# Patient Record
Sex: Male | Born: 1945 | Race: White | Hispanic: No | Marital: Married | Smoking: Former smoker
Health system: Southern US, Community
[De-identification: ages and names within clinical notes are randomized; demographics above are authoritative.]

## PROBLEM LIST (undated history)

## (undated) DIAGNOSIS — I6529 Occlusion and stenosis of unspecified carotid artery: Secondary | ICD-10-CM

## (undated) DIAGNOSIS — E785 Hyperlipidemia, unspecified: Secondary | ICD-10-CM

## (undated) DIAGNOSIS — I10 Essential (primary) hypertension: Secondary | ICD-10-CM

## (undated) DIAGNOSIS — I639 Cerebral infarction, unspecified: Secondary | ICD-10-CM

## (undated) DIAGNOSIS — J449 Chronic obstructive pulmonary disease, unspecified: Secondary | ICD-10-CM

## (undated) HISTORY — DX: Occlusion and stenosis of unspecified carotid artery: I65.29

## (undated) HISTORY — DX: Hyperlipidemia, unspecified: E78.5

## (undated) HISTORY — DX: Chronic obstructive pulmonary disease, unspecified: J44.9

## (undated) HISTORY — DX: Cerebral infarction, unspecified: I63.9

## (undated) HISTORY — DX: Essential (primary) hypertension: I10

---

## 2001-01-07 ENCOUNTER — Ambulatory Visit (HOSPITAL_COMMUNITY): Admission: RE | Admit: 2001-01-07 | Discharge: 2001-01-07 | Payer: Self-pay | Admitting: Family Medicine

## 2001-01-07 ENCOUNTER — Encounter: Payer: Self-pay | Admitting: Family Medicine

## 2003-02-06 ENCOUNTER — Ambulatory Visit (HOSPITAL_COMMUNITY): Admission: RE | Admit: 2003-02-06 | Discharge: 2003-02-06 | Payer: Self-pay | Admitting: Family Medicine

## 2003-02-06 ENCOUNTER — Encounter: Payer: Self-pay | Admitting: Family Medicine

## 2009-06-19 HISTORY — PX: CAROTID ENDARTERECTOMY: SUR193

## 2009-09-24 ENCOUNTER — Ambulatory Visit (HOSPITAL_COMMUNITY): Admission: RE | Admit: 2009-09-24 | Discharge: 2009-09-24 | Payer: Self-pay | Admitting: Family Medicine

## 2009-09-29 ENCOUNTER — Encounter: Admission: RE | Admit: 2009-09-29 | Discharge: 2009-09-29 | Payer: Self-pay | Admitting: Vascular Surgery

## 2009-09-29 ENCOUNTER — Ambulatory Visit: Payer: Self-pay | Admitting: Vascular Surgery

## 2009-10-12 ENCOUNTER — Encounter: Payer: Self-pay | Admitting: Vascular Surgery

## 2009-10-12 ENCOUNTER — Ambulatory Visit: Payer: Self-pay | Admitting: Cardiovascular Disease

## 2009-10-12 ENCOUNTER — Inpatient Hospital Stay (HOSPITAL_COMMUNITY): Admission: RE | Admit: 2009-10-12 | Discharge: 2009-10-13 | Payer: Self-pay | Admitting: Vascular Surgery

## 2009-10-12 ENCOUNTER — Ambulatory Visit: Payer: Self-pay | Admitting: Vascular Surgery

## 2009-10-17 ENCOUNTER — Ambulatory Visit: Payer: Self-pay | Admitting: Pulmonary Disease

## 2009-10-17 ENCOUNTER — Inpatient Hospital Stay (HOSPITAL_COMMUNITY): Admission: EM | Admit: 2009-10-17 | Discharge: 2009-10-21 | Payer: Self-pay | Admitting: Emergency Medicine

## 2009-11-03 ENCOUNTER — Ambulatory Visit: Payer: Self-pay | Admitting: Vascular Surgery

## 2010-07-10 ENCOUNTER — Encounter: Payer: Self-pay | Admitting: Family Medicine

## 2010-09-06 LAB — CBC
HCT: 31.5 % — ABNORMAL LOW (ref 39.0–52.0)
HCT: 33.6 % — ABNORMAL LOW (ref 39.0–52.0)
HCT: 44.3 % (ref 39.0–52.0)
HCT: 47 % (ref 39.0–52.0)
Hemoglobin: 11.8 g/dL — ABNORMAL LOW (ref 13.0–17.0)
Hemoglobin: 11.8 g/dL — ABNORMAL LOW (ref 13.0–17.0)
Hemoglobin: 15 g/dL (ref 13.0–17.0)
Hemoglobin: 12.6 g/dL — ABNORMAL LOW (ref 13.0–17.0)
Hemoglobin: 16.5 g/dL (ref 13.0–17.0)
MCHC: 33.9 g/dL (ref 30.0–36.0)
MCHC: 35 g/dL (ref 30.0–36.0)
MCHC: 35 g/dL (ref 30.0–36.0)
MCHC: 35.1 g/dL (ref 30.0–36.0)
MCHC: 35.3 g/dL (ref 30.0–36.0)
MCV: 90.6 fL (ref 78.0–100.0)
MCV: 91.4 fL (ref 78.0–100.0)
MCV: 90.7 fL (ref 78.0–100.0)
Platelets: 267 10*3/uL (ref 150–400)
Platelets: 233 10*3/uL (ref 150–400)
RBC: 3.48 MIL/uL — ABNORMAL LOW (ref 4.22–5.81)
RBC: 3.67 MIL/uL — ABNORMAL LOW (ref 4.22–5.81)
RBC: 3.97 MIL/uL — ABNORMAL LOW (ref 4.22–5.81)
RDW: 14.1 % (ref 11.5–15.5)
RDW: 14.1 % (ref 11.5–15.5)
RDW: 14.4 % (ref 11.5–15.5)
WBC: 6.9 10*3/uL (ref 4.0–10.5)
WBC: 7.5 10*3/uL (ref 4.0–10.5)

## 2010-09-06 LAB — BASIC METABOLIC PANEL
BUN: 13 mg/dL (ref 6–23)
CO2: 22 mEq/L (ref 19–32)
CO2: 24 mEq/L (ref 19–32)
CO2: 25 mEq/L (ref 19–32)
Calcium: 8.1 mg/dL — ABNORMAL LOW (ref 8.4–10.5)
Chloride: 104 mEq/L (ref 96–112)
Chloride: 110 mEq/L (ref 96–112)
Chloride: 113 mEq/L — ABNORMAL HIGH (ref 96–112)
Creatinine, Ser: 1.11 mg/dL (ref 0.4–1.5)
GFR calc Af Amer: >60 mL/min (ref >60)
GFR calc Af Amer: >60 mL/min (ref >60)
GFR calc Af Amer: >60 mL/min (ref >60)
GFR calc non Af Amer: >60 mL/min (ref >60)
Glucose, Bld: 128 mg/dL — ABNORMAL HIGH (ref 70–99)
Potassium: 3.5 mEq/L (ref 3.5–5.1)
Potassium: 3.5 mEq/L (ref 3.5–5.1)
Potassium: 3.7 mEq/L (ref 3.5–5.1)
Sodium: 135 mEq/L (ref 135–145)
Sodium: 139 mEq/L (ref 135–145)
Sodium: 137 mEq/L (ref 135–145)

## 2010-09-06 LAB — POCT I-STAT 3, ART BLOOD GAS (G3+)
Acid-base deficit: 2 mmol/L (ref 0.0–2.0)
Acid-base deficit: 7 mmol/L — ABNORMAL HIGH (ref 0.0–2.0)
Bicarbonate: 22.2 mEq/L (ref 20.0–24.0)
Bicarbonate: 22.8 mEq/L (ref 20.0–24.0)
Bicarbonate: 23.1 mEq/L (ref 20.0–24.0)
O2 Saturation: 97 %
Patient temperature: 37.2
TCO2: 24 mmol/L (ref 0–100)
TCO2: 24 mmol/L (ref 0–100)
pCO2 arterial: 41.2 mmHg (ref 35.0–45.0)
pCO2 arterial: 43.5 mmHg (ref 35.0–45.0)
pCO2 arterial: 44 mmHg (ref 35.0–45.0)
pH, Arterial: 7.318 — ABNORMAL LOW (ref 7.350–7.450)
pH, Arterial: 7.325 — ABNORMAL LOW (ref 7.350–7.450)
pO2, Arterial: 124 mmHg — ABNORMAL HIGH (ref 80.0–100.0)
pO2, Arterial: 65 mmHg — ABNORMAL LOW (ref 80.0–100.0)
pO2, Arterial: 72 mmHg — ABNORMAL LOW (ref 80.0–100.0)
pO2, Arterial: 91 mmHg (ref 80.0–100.0)

## 2010-09-06 LAB — COMPREHENSIVE METABOLIC PANEL
ALT: 24 U/L (ref 0–53)
Albumin: 3.9 g/dL (ref 3.5–5.2)
Alkaline Phosphatase: 53 U/L (ref 39–117)
Alkaline Phosphatase: 68 U/L (ref 39–117)
BUN: 16 mg/dL (ref 6–23)
BUN: 15 mg/dL (ref 6–23)
Calcium: 7.7 mg/dL — ABNORMAL LOW (ref 8.4–10.5)
Calcium: 9.1 mg/dL (ref 8.4–10.5)
Creatinine, Ser: 1.1 mg/dL (ref 0.4–1.5)
Creatinine, Ser: 1.01 mg/dL (ref 0.4–1.5)
GFR calc non Af Amer: >60 mL/min (ref >60)
Glucose, Bld: 145 mg/dL — ABNORMAL HIGH (ref 70–99)
Glucose, Bld: 111 mg/dL — ABNORMAL HIGH (ref 70–99)
Potassium: 4.6 mEq/L (ref 3.5–5.1)
Sodium: 135 mEq/L (ref 135–145)
Total Bilirubin: 0.8 mg/dL (ref 0.3–1.2)
Total Protein: 7.3 g/dL (ref 6.0–8.3)
Total Protein: 6.8 g/dL (ref 6.0–8.3)

## 2010-09-06 LAB — URINALYSIS, ROUTINE W REFLEX MICROSCOPIC
Bilirubin Urine: NEGATIVE
Hgb urine dipstick: NEGATIVE
Ketones, ur: NEGATIVE mg/dL
Ketones, ur: NEGATIVE mg/dL
Leukocytes, UA: NEGATIVE
Leukocytes, UA: NEGATIVE
Nitrite: NEGATIVE
Nitrite: NEGATIVE
Nitrite: NEGATIVE
Protein, ur: 100 mg/dL — AB
Protein, ur: 30 mg/dL — AB
Protein, ur: 100 mg/dL — AB
Specific Gravity, Urine: 1.017 (ref 1.005–1.030)
Urobilinogen, UA: 1 mg/dL (ref 0.0–1.0)
pH: 5 (ref 5.0–8.0)

## 2010-09-06 LAB — DIFFERENTIAL
Basophils Absolute: 0.1 10*3/uL (ref 0.0–0.1)
Eosinophils Relative: 3 % (ref 0–5)
Monocytes Relative: 13 % — ABNORMAL HIGH (ref 3–12)
Neutrophils Relative %: 49 % (ref 43–77)

## 2010-09-06 LAB — URINE CULTURE: Culture: NO GROWTH

## 2010-09-06 LAB — LIPID PANEL
Total CHOL/HDL Ratio: 3.7 RATIO
Triglycerides: 75 mg/dL (ref <150)
VLDL: 15 mg/dL (ref 0–40)

## 2010-09-06 LAB — MAGNESIUM: Magnesium: 2.1 mg/dL (ref 1.5–2.5)

## 2010-09-06 LAB — PHOSPHORUS
Phosphorus: 2.6 mg/dL (ref 2.3–4.6)
Phosphorus: 3.4 mg/dL (ref 2.3–4.6)
Phosphorus: 4.8 mg/dL — ABNORMAL HIGH (ref 2.3–4.6)

## 2010-09-06 LAB — CK TOTAL AND CKMB (NOT AT ARMC)
CK, MB: 2.9 ng/mL (ref 0.3–4.0)
Relative Index: 2.9 — ABNORMAL HIGH (ref 0.0–2.5)
Total CK: 100 U/L (ref 7–232)

## 2010-09-06 LAB — URINE MICROSCOPIC-ADD ON

## 2010-09-06 LAB — HEMOGLOBIN A1C
Hgb A1c MFr Bld: 5.9 % — ABNORMAL HIGH (ref <5.7)
Mean Plasma Glucose: 123 mg/dL — ABNORMAL HIGH (ref <117)

## 2010-09-06 LAB — TROPONIN I: Troponin I: 0.01 ng/mL (ref 0.00–0.06)

## 2010-09-06 LAB — LACTIC ACID, PLASMA: Lactic Acid, Venous: 24.1 mmol/L — ABNORMAL HIGH (ref 0.5–2.2)

## 2010-09-06 LAB — LEGIONELLA ANTIGEN, URINE

## 2010-09-06 LAB — CULTURE, RESPIRATORY W GRAM STAIN

## 2010-09-06 LAB — ABO/RH: ABO/RH(D): O POS

## 2010-09-06 LAB — BRAIN NATRIURETIC PEPTIDE
Pro B Natriuretic peptide (BNP): 325 pg/mL — ABNORMAL HIGH (ref 0.0–100.0)
Pro B Natriuretic peptide (BNP): 479 pg/mL — ABNORMAL HIGH (ref 0.0–100.0)

## 2010-09-06 LAB — TYPE AND SCREEN

## 2010-09-06 LAB — RAPID URINE DRUG SCREEN, HOSP PERFORMED
Barbiturates: NOT DETECTED
Benzodiazepines: POSITIVE — AB

## 2010-09-06 LAB — GLUCOSE, CAPILLARY: Glucose-Capillary: 98 mg/dL (ref 70–99)

## 2010-09-06 LAB — PROTIME-INR
INR: 0.97 (ref 0.00–1.49)
Prothrombin Time: 14.5 seconds (ref 11.6–15.2)
Prothrombin Time: 15.3 seconds — ABNORMAL HIGH (ref 11.6–15.2)
Prothrombin Time: 12.8 seconds (ref 11.6–15.2)

## 2010-09-06 LAB — CULTURE, BLOOD (ROUTINE X 2)
Culture: NO GROWTH
Culture: NO GROWTH

## 2010-09-06 LAB — APTT: aPTT: 29 seconds (ref 24–37)

## 2010-11-01 NOTE — Assessment & Plan Note (Signed)
OFFICE VISIT   ESCHOL, AUXIER  DOB:  01-09-1946                                       11/03/2009  CHART#:[id number]   Mr. Saunders returns for follow-up today.  He underwent a left carotid  endarterectomy on October 12, 2009.  His postoperative stay was  uneventful.  However, he was readmitted to the hospital several days  after his operation with a reperfusion syndrome.  This manifested itself  as a seizure.  He also had some confusion during the time of admission.  He was initially admitted to the intensive care unit and then discharged  within a few days.  He returns today for further followup.   On physical exam today, his neck incision is well-healed.  There is no  carotid bruit.  Blood pressure today lower extremity.  This was measured  in his leg because he has known subclavian artery occlusive disease.  Pulses 63.  Temperature is 98.2.  Neurologic exam shows symmetric upper  extremity lower extremity motor strength which is 5/5.   Mr. Bartolomei is doing well at this point.  He seems to have recovered from  his reperfusion syndrome.  He will follow up with neurology regarding  his seizure medication.  He will currently stay on aspirin 325 mg once a  day.  He will follow up in our carotid surveillance protocol.     Janetta Hora. Fields, MD  Electronically Signed   CEF/MEDQ  D:  11/17/2009  T:  11/18/2009  Job:  [id number]

## 2010-11-01 NOTE — Procedures (Signed)
CAROTID DUPLEX EXAM   INDICATION:  Followup carotid disease.   HISTORY:  Diabetes:  No.  Cardiac:  No.  Hypertension:  No.  Smoking:  Yes.  Previous Surgery:  No.  CV History:  No.  Amaurosis Fugax No, Paresthesias No, Hemiparesis No                                       RIGHT             LEFT  Brachial systolic pressure:         94                98  Brachial Doppler waveforms:         Biphasic          Biphasic  Vertebral direction of flow:                          Bidirectional  DUPLEX VELOCITIES (cm/sec)  CCA peak systolic                                     185 distal 465  ECA peak systolic                                     238  ICA peak systolic                                     420  ICA end diastolic                                     163  PLAQUE MORPHOLOGY:                                    Heterogeneous  PLAQUE AMOUNT:                                        Severe  PLAQUE LOCATION:                                      ICA, ECA, CCA   IMPRESSION:  1. Left internal carotid artery suggests 80%-99% stenosis.  2. Left external carotid artery stenosis.  3. Distal left common carotid artery has elevated velocities of 465      cm/s.  4. Bidirectional left vertebral.   ___________________________________________  Janetta Hora. Fields, MD   CB/MEDQ  D:  09/29/2009  T:  09/29/2009  Job:  [id number]

## 2010-11-01 NOTE — Consult Note (Signed)
NEW PATIENT CONSULTATION   Zachary Edwards, Zachary Edwards  DOB:  01-24-1946                                       09/29/2009  CHART#:[id number]   CHIEF COMPLAINT:  Carotid stenosis.   The patient is a 65 year old male referred for evaluation of carotid  stenosis.  His primary care doctor is Dr. Gerda Diss and he is sent here  today for evaluation of this.  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  He does not have any symptoms of TIA, amaurosis or  stroke.  Primary atherosclerotic risk factors include elevated  cholesterol and smoking.  He has been a heavy smoker for a number of  years.  He is currently on aspirin 81 mg once a day.  He also takes  pravastatin.   He denies any episodes of syncope.  He denies any episodes of dizziness.  He has never had a previous seizure.  He denies headaches.   PAST MEDICAL HISTORY:  Otherwise unremarkable.   FAMILY HISTORY:  Unremarkable.   SOCIAL HISTORY:  He is married, works as a Naval architect, has three  children.  Smoking history is as listed above.  He is not consume  alcohol regularly.  Greater than 3 minutes today were spent regarding  smoking cessation counseling involving several different techniques and  the risks of long-term smoking.   REVIEW OF SYSTEMS:  A full 12 point review of systems was performed with  the patient today.  Please see intake referral form for details  regarding this.  Of note, he denied any symptoms of coronary disease  such as shortness of breath or chest pain and he states he is able to  climb greater than 2 flights of stairs without chest pain.   PHYSICAL EXAMINATION:  Blood pressure is 100/68 in the left arm, 94/63  in the right arm; heart rate 63 and regular, temperature is 98.2.  HEENT:  Unremarkable.  Neck:  2+ carotid pulses with bilateral bruits.  He also has supraclavicular bruits.  Chest:  Clear to  auscultation.  Cardiac:  Regular rate and rhythm without murmur.  Abdomen:  Soft,  nontender, nondistended.  No masses.  Extremities:  He has absent  brachial and radial pulses in the upper extremities, he has 2+ femoral  pulses.  He has absent pedal pulses in the left foot.  He has a 2+ right  posterior tibial pulse.  He has absent right dorsalis pedis pulse.  Skin:  Has no open ulcers or rashes.  Musculoskeletal:  Exam shows no  significant major joint deformities.  Neurologic exam shows symmetric  upper extremity and lower extremity motor strength which is 5/5.  Cranial nerves II-XII are intact.   He had a carotid duplex exam today on the left side which showed a  greater than 80% left internal carotid artery stenosis.  The distal left  common carotid artery also had elevated velocities suggesting some  proximal common carotid disease.  He had bidirectional flow in the left  vertebral artery suggesting subclavian stenosis.  This was compared to  his previous carotid duplex exam at [facility] which was  performed on September 24, 2009.  This showed antegrade flow in the right  vertebral artery, no significant stenosis in the right internal carotid  artery and a high-grade stenosis in the left internal carotid artery.   Based on his absent upper extremity pulses and the presence of proximal  left common carotid disease, we obtained a CT angiogram of the neck on  him today.  This showed occlusion of his innominate artery.  This also  showed occlusion of his left subclavian artery.  He had bilateral small  vertebrals that both had flow within them.  This also confirmed the left  internal carotid artery stenosis, which is high-grade, but also  extension of plaque into the proximal common carotid artery, although  this is all fairly well-defined and confined to the cervical portion of  the carotid artery.   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  I believe he is at high risk of stroke without revascularization.  We  have scheduled him for a left carotid endarterectomy in the next 2  weeks.  Risks, benefits, possible complications of procedure, including  but not limited to bleeding, infection, stroke risk 1% to 2% and cranial  nerve injury were discussed with the patient today.  He understands and  agrees to proceed.  He will continue to take his aspirin daily, even the  day of operation.  We will also discuss with him that upper extremity  blood pressure measurements are not going to be accurate on him because  of bilateral subclavian artery occlusions.  I do not believe these need  treatment currently since they are asymptomatic with no evidence of  upper extremity weakness and no evidence of steal-type symptoms.  His  most accurate blood pressure is going to be in his lower extremity.  Most likely he has hypertension that is unknown due to the fact that he  has subclavian artery occlusive disease.     Janetta Hora. Fields, MD  Electronically Signed   CEF/MEDQ  D:  09/30/2009  T:  09/30/2009  Job:  [id number]   cc:   Lorin Picket A. Gerda Diss, MD

## 2011-09-13 ENCOUNTER — Encounter: Payer: Self-pay | Admitting: Vascular Surgery

## 2011-09-14 ENCOUNTER — Other Ambulatory Visit: Payer: Self-pay

## 2011-09-14 ENCOUNTER — Ambulatory Visit: Payer: Self-pay | Admitting: Vascular Surgery

## 2011-09-20 ENCOUNTER — Encounter: Payer: Self-pay | Admitting: Vascular Surgery

## 2011-09-21 ENCOUNTER — Ambulatory Visit (INDEPENDENT_AMBULATORY_CARE_PROVIDER_SITE_OTHER): Payer: Medicare Other | Admitting: *Deleted

## 2011-09-21 ENCOUNTER — Encounter: Payer: Self-pay | Admitting: Vascular Surgery

## 2011-09-21 ENCOUNTER — Ambulatory Visit (INDEPENDENT_AMBULATORY_CARE_PROVIDER_SITE_OTHER): Payer: Medicare Other | Admitting: Vascular Surgery

## 2011-09-21 VITALS — BP 95/65 | HR 72 | Temp 98.4°F | Resp 16 | Ht 73.0 in | Wt 172.0 lb

## 2011-09-21 DIAGNOSIS — I6529 Occlusion and stenosis of unspecified carotid artery: Secondary | ICD-10-CM

## 2011-09-21 DIAGNOSIS — Z48812 Encounter for surgical aftercare following surgery on the circulatory system: Secondary | ICD-10-CM

## 2011-09-21 NOTE — Progress Notes (Signed)
Addended by: Sharee Pimple on: 09/21/2011 04:08 PM   Modules accepted: Orders

## 2011-09-21 NOTE — Progress Notes (Signed)
[facility] [place] HISTORY AND PHYSICAL   History of Present Illness:  Patient is a 66 y.o. year old male who presents for follow-up evaluation for carotid stenosis.  He is on Plavix/Aspirin for antiplatelet therapy.  His atherosclerotic risk factors remain elevated cholesterol, hypertension, smoking (remote), and COPD.  These are all currently stable and followed by his primary care physician.  He denies any new neurologic events including amaurosis, numbness, or weakness.  He was last seen in 2011. He previously underwent a left carotid endarterectomy in April of 2011. He has a known chronic innominate artery occlusion as well as a known chronic left subclavian artery occlusion. He also had a hyperperfusion syndrome at the time of his carotid endarterectomy.  Past Medical History  Diagnosis Date  . Hypertension   . Hyperlipidemia   . Carotid artery occlusion   . COPD (chronic obstructive pulmonary disease)     Past Surgical History  Procedure Date  . Carotid endarterectomy 2011    Left CEA    Review of Systems:  Neurologic: as above Cardiac:denies shortness of breath or chest pain Pulmonary: denies cough or wheeze  Social History History  Substance Use Topics  . Smoking status: Former Smoker    Quit date: 09/17/2009  . Smokeless tobacco: Not on file  . Alcohol Use: No    Allergies  Allergies  Allergen Reactions  . Penicillins      Current Outpatient Prescriptions  Medication Sig Dispense Refill  . amLODipine (NORVASC) 10 MG tablet Take 10 mg by mouth daily.      Marland Kitchen aspirin 81 MG tablet Take 81 mg by mouth daily.      . hydrochlorothiazide (HYDRODIURIL) 25 MG tablet Take 25 mg by mouth daily.      . pravastatin (PRAVACHOL) 20 MG tablet Take 40 mg by mouth daily.       Marland Kitchen levETIRAcetam (KEPPRA) 1000 MG tablet Take 1,000 mg by mouth 2 (two) times daily.       Plavix  Physical Examination  Filed Vitals:   09/21/11 1537 09/21/11 1539  BP:  98/73 95/65  Pulse: 79 72  Temp: 98.4 F (36.9 C)   TempSrc: Oral   Resp: 16   Height: 6\' 1"  (1.854 m)   Weight: 172 lb (78.019 kg)     Body mass index is 22.69 kg/(m^2).  General:  Alert and oriented, no acute distress HEENT: Normal Neck: Bilateral cervical bruit no JVD, diminished right carotid pulse, palpable left carotid pulse Pulmonary: Clear to auscultation bilaterally Cardiac: Regular Rate and Rhythm without murmur Neurologic: Upper and lower extremity motor 5/5 and symmetric  DATA: Carotid duplex exam today showed abnormal brachial waveforms bilaterally consistent with innominate artery and left subclavian artery occlusion he had retrograde right vertebral flow he had bidirectional left vertebral flow there was a suggestion of increased velocity in the proximal left common carotid artery with possible left common carotid stenosis. I reviewed and interpreted this study   ASSESSMENT:   Asymptomatic possible recurrent left common carotid stenosis with patent left endarterectomy   PLAN:  We will obtain a CT angiogram of the neck to further define the proximal common carotid artery. He'll return for followup after his CT Angio.  Fabienne Bruns, MD [facility] [place] Office: [phone] Pager: [phone]

## 2011-09-28 ENCOUNTER — Ambulatory Visit: Payer: Medicare Other | Admitting: Vascular Surgery

## 2011-09-28 ENCOUNTER — Other Ambulatory Visit (HOSPITAL_COMMUNITY): Payer: Medicare Other

## 2011-09-29 NOTE — Procedures (Unsigned)
CAROTID DUPLEX EXAM  INDICATION:  Carotid endarterectomy.  HISTORY: Diabetes:  No Cardiac:  No Hypertension:  No Smoking:  Yes Previous Surgery:  Right carotid endarterectomy on 10/12/2009. CV History:  Currently asymptomatic Amaurosis Fugax No, Paresthesias No, Hemiparesis No                                      RIGHT             LEFT Brachial systolic pressure:         86                80 Brachial Doppler waveforms:         Abnormal          Abnormal Vertebral direction of flow:        Retrograde at origin                Bidirectional DUPLEX VELOCITIES (cm/sec) CCA peak systolic                   37                M=80/D=586 ECA peak systolic                   52                171 ICA peak systolic                   30                174 ICA end diastolic                                     50 PLAQUE MORPHOLOGY:                  Heterogeneous PLAQUE AMOUNT:                      Mild              None PLAQUE LOCATION:                    ICA/ECA  IMPRESSION: 1. Patent left carotid endarterectomy site noted.  Elevated velocity     of 586 cm/sec noted in the left distal common carotid artery;     however, this appears to be due to a change in vessel diameter and     compensatory flow. 2. The right internal carotid artery appears to have bidirectional     flow.  The right ECA and ICA were discerned by vessel branching. 3. Retrograde flow was noted in the right proximal subclavian and     proximal vertebral arteries.  The mid right vertebral artery was     not adequately visualized.  Monophasic, antegrade flow is noted in     the bilateral mid subclavian arteries. 4. Known innominate and left proximal subclavian artery occlusions     noted from previous CT angio on 10/07/2009 which do correlate with     today's exam. 5. Elevated velocities remain present in the left common carotid     artery when compared to the previous exam on  09/29/2009.  ___________________________________________ Janetta Hora. Fields, MD  CH/MEDQ  D:  09/21/2011  T:  09/21/2011  Job:  [id number]

## 2011-11-10 IMAGING — US US CAROTID DUPLEX BILAT
1 series · 13 of 24 positions shown · non-contrast
Comparison: None

Addendum Begins

Flow in the left vertebral artery is RETROGRADE, suggesting
proximal subclavian artery stenosis or occlusion.  Correlate with
any clinical evidence of subclavian steal.
Addendum Ends
CLINICAL DATA: Bilateral carotid bruit.  Tobacco use.
BILATERAL CAROTID DUPLEX ULTRASOUND
TECHNIQUE: Gray scale imaging, color Doppler and duplex ultrasound
was performed of bilateral carotid and vertebral arteries in the
neck.

[Series 1: us carotid duplex bilat · 0.05mm/px · 95 acquisitions, 13 frames shown]
[im 1/95]
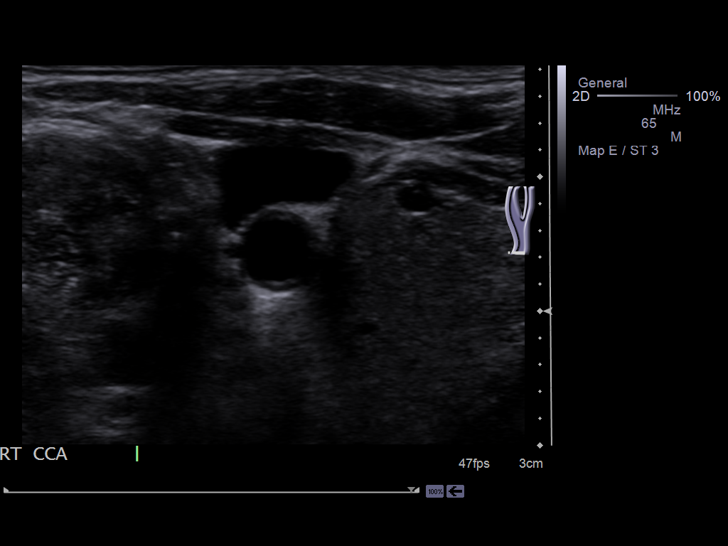
[im 9/95]
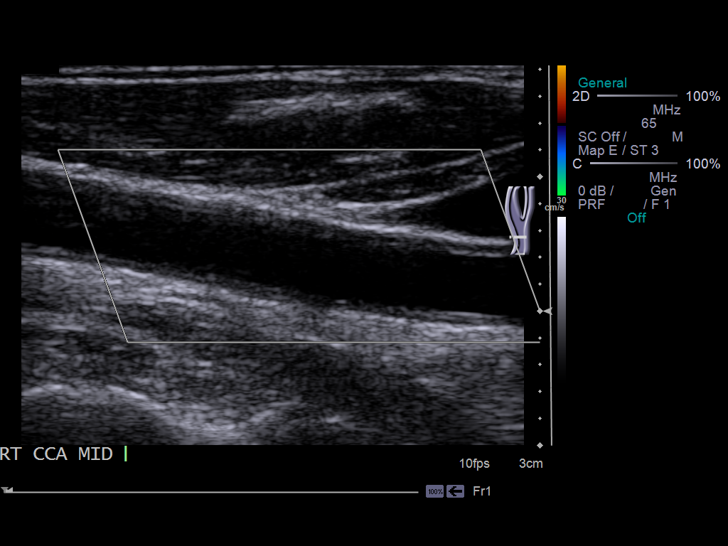
[im 17/95]
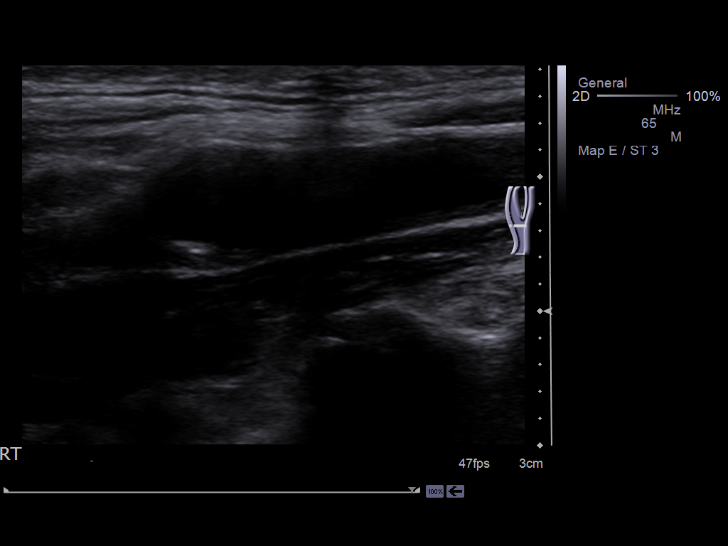
[im 29/95]
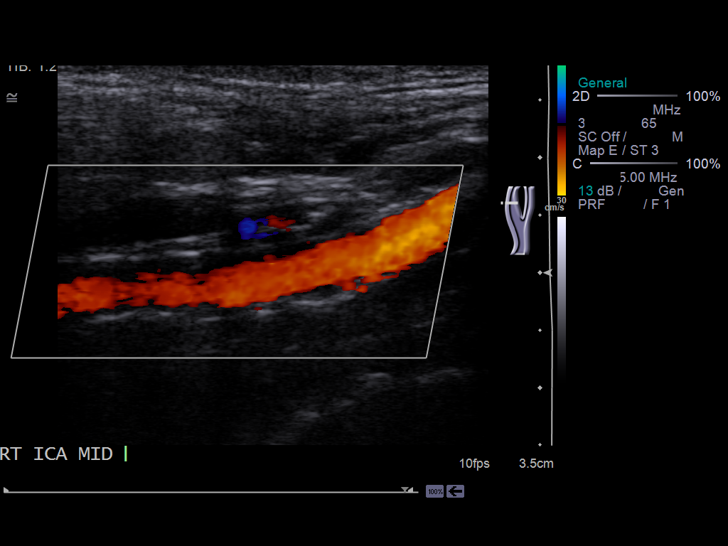
[im 37/95]
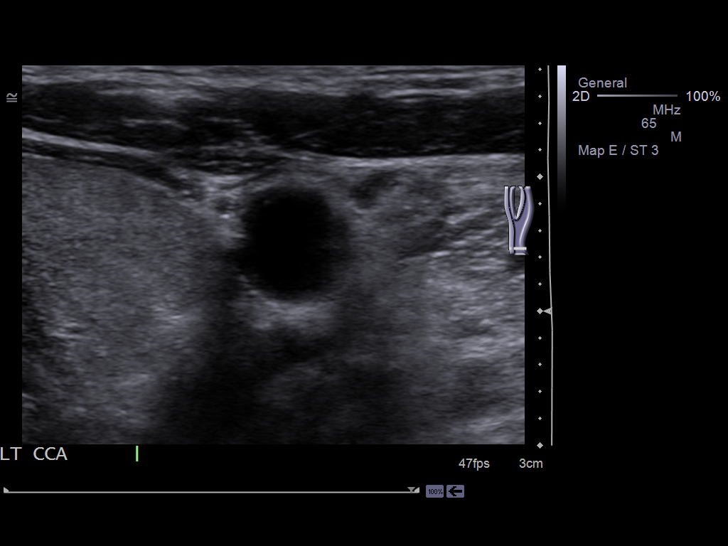
[im 45/95]
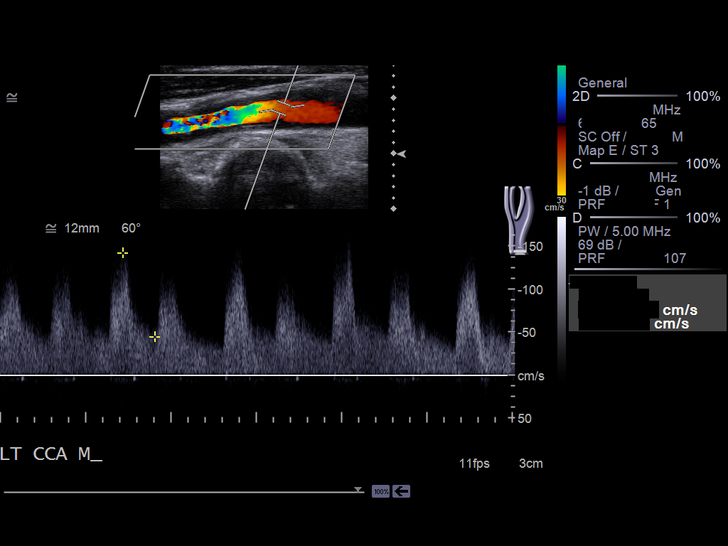
[im 50/95]
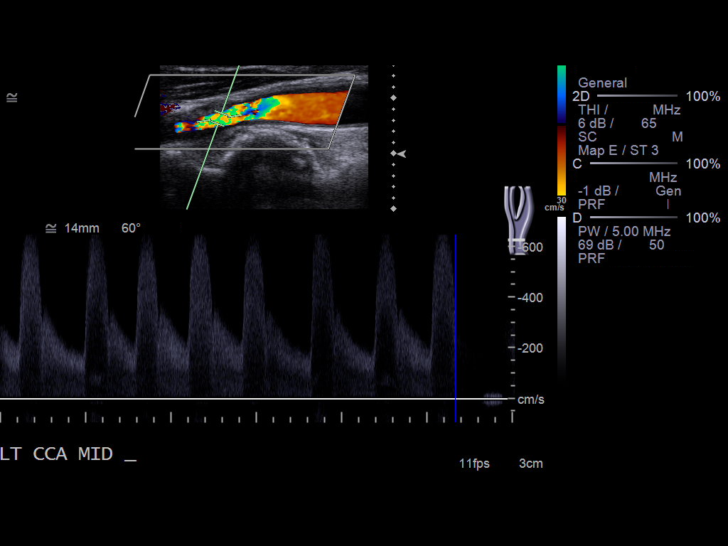
[im 54/95]
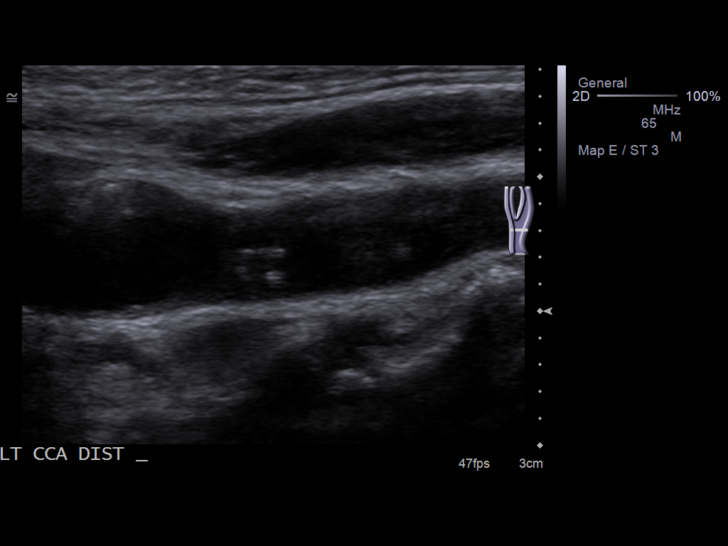
[im 62/95]
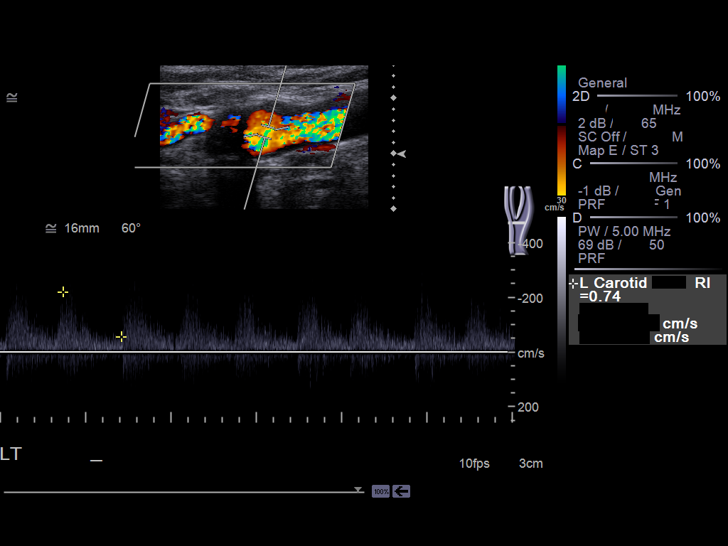
[im 70/95]
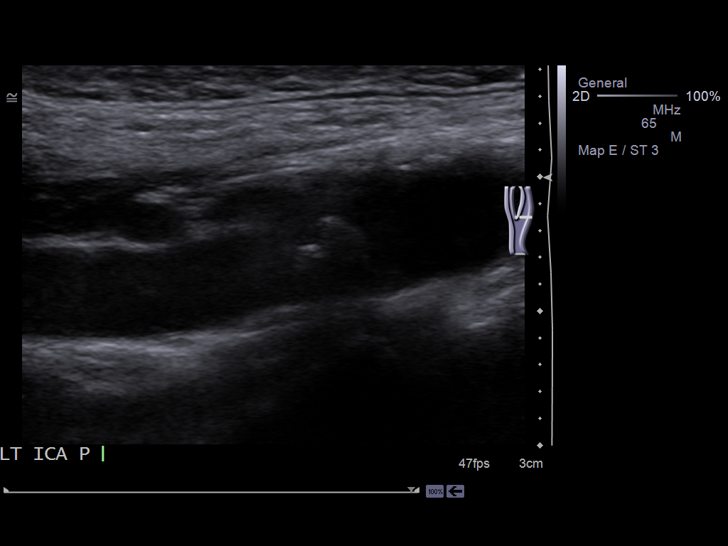
[im 78/95]
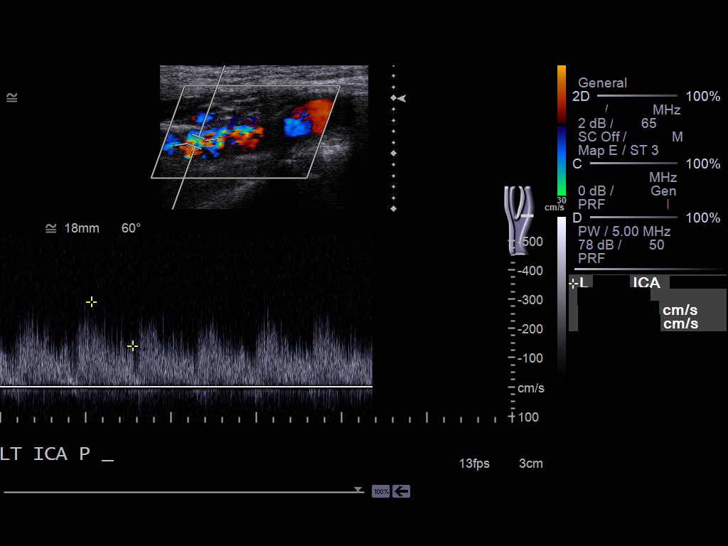
[im 86/95]
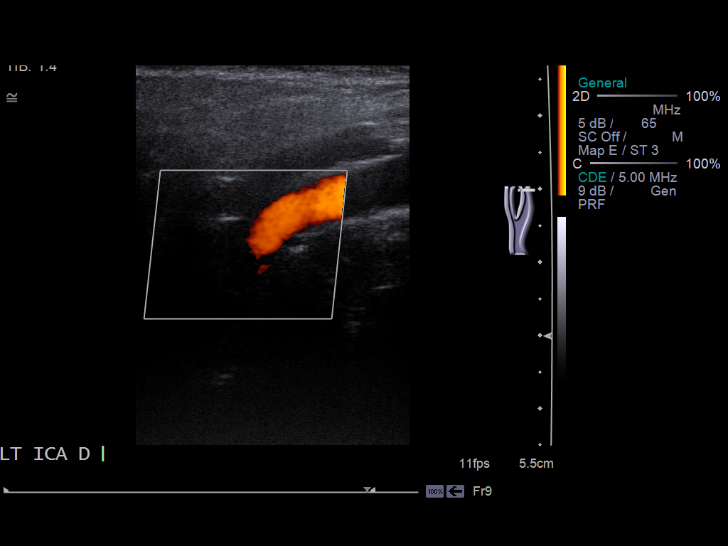
[im 95/95]
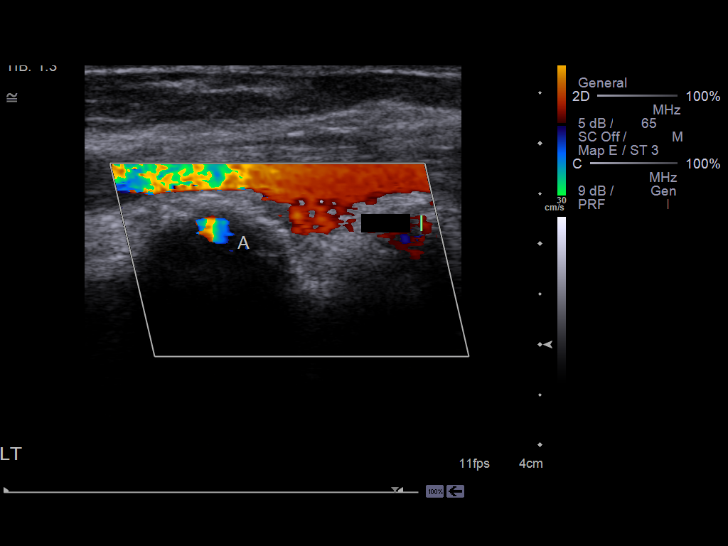

[13 of 24 positions shown; findings below may reference images not displayed]

Criteria:  Quantification of carotid stenosis is based on velocity
parameters that correlate the residual internal carotid diameter
with NASCET-based stenosis levels, using the diameter of the distal
internal carotid lumen as the denominator for stenosis measurement.

The following velocity measurements were obtained:

                 PEAK SYSTOLIC/END DIASTOLIC
RIGHT
ICA:                        59/33cm/sec
CCA:                        47/30cm/sec
SYSTOLIC ICA/CCA RATIO:
DIASTOLIC ICA/CCA RATIO:
ECA:                        44/24cm/sec

LEFT
ICA:                        510/194cm/sec
CCA:                        643/214cm/sec
SYSTOLIC ICA/CCA RATIO:
DIASTOLIC ICA/CCA RATIO:
ECA:                        151/31cm/sec
FINDINGS: RIGHT CAROTID ARTERY: Mild diffuse intimal thickening in the common
carotid artery and bulb.  There is minimal carotid bulb plaque,
partially calcified, extending to the bifurcation without high-
grade stenosis.  Normal wave forms with no focal aliasing on color
Doppler interrogation.

RIGHT VERTEBRAL ARTERY:  Normal flow direction and waveform.

LEFT CAROTID ARTERY: Eccentric partially calcified plaque in the
mid common carotid artery with a focal area of   markedly elevated
peak systolic velocities.  There is associated focal aliasing on
color Doppler interrogation in this region.  There is mild plaque
at the carotid bulb.  There is focal plaque in the proximal
internal carotid artery with scattered calcifications, resulting in
some distal acoustic shadowing.  Just distal to the lesion are
markedly elevated peak systolic velocities.

LEFT VERTEBRAL ARTERY:  Normal flow direction and waveform.
IMPRESSION: 1.  Tandem left common carotid and proximal ICA lesions, estimated
70-99% diameter stenosis each.  Consider vascular surgical or
neurointerventional radiology consultation.
2.  Mild right carotid bifurcation plaque without significant
stenosis.

## 2011-11-15 IMAGING — CT CT ANGIO NECK
3 of 6 series · 8 of 33 positions shown · IV contrast (CONTRAST)
Comparison: Carotid  Doppler 09/24/2009

CLINICAL DATA: Carotid stenosis.  Abnormal carotid Doppler.

CT ANGIOGRAPHY NECK
TECHNIQUE: Multidetector CT imaging of the neck was performed
using the standard protocol during bolus administration of
intravenous contrast.  Multiplanar CT image reconstructions
including MIPs were obtained to evaluate the vascular anatomy.
Carotid stenosis measurements (when applicable) are obtained
utilizing NASCET criteria, using the distal internal carotid
diameter as the denominator.
Contrast:  150 ml Omnipaque 350 IV

[Series 10: 2.5 mm · axial · 0.36mm/px · z∈[+68,+213]mm · 3 of 118 slices shown]
[im 30/118  soft-tissue]
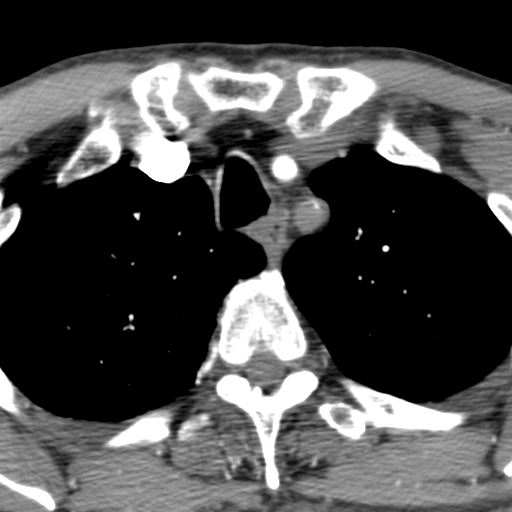
[im 59/118  bone]
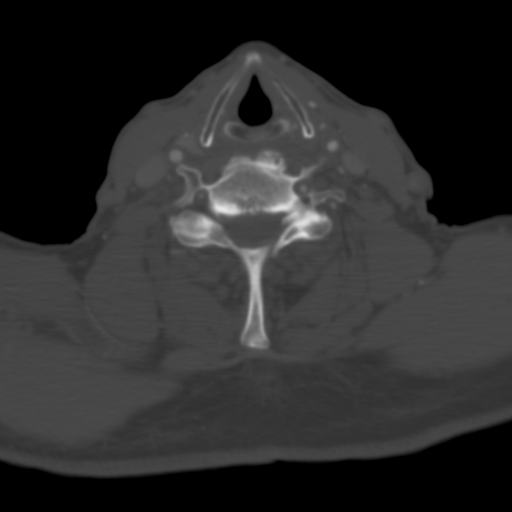
[im 88/118  soft-tissue]
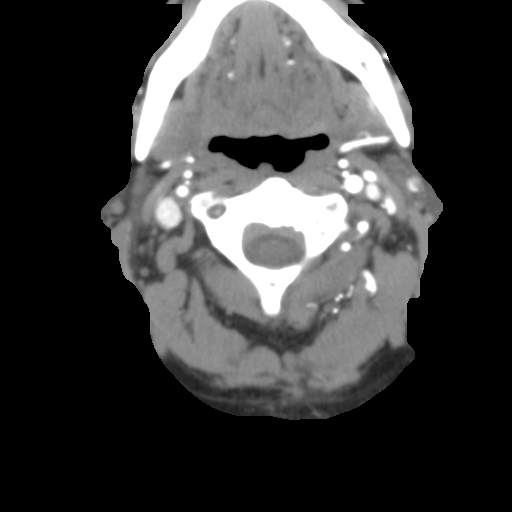

[ang axials · axial · 0.36mm/px · z∈[+38,+176]mm · 4 of 158 slices shown]
[im 32/158  soft-tissue]
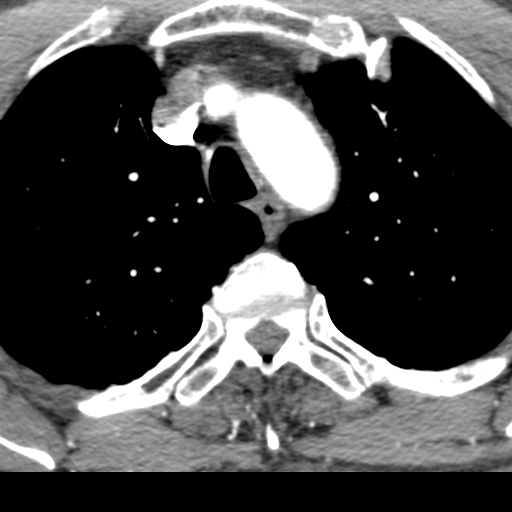
[im 63/158  soft-tissue]
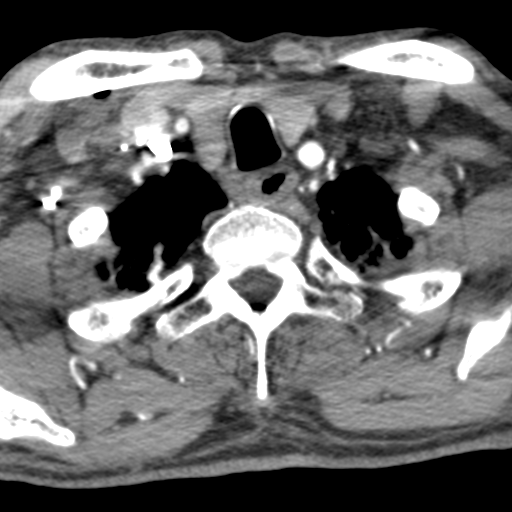
[im 95/158  soft-tissue]
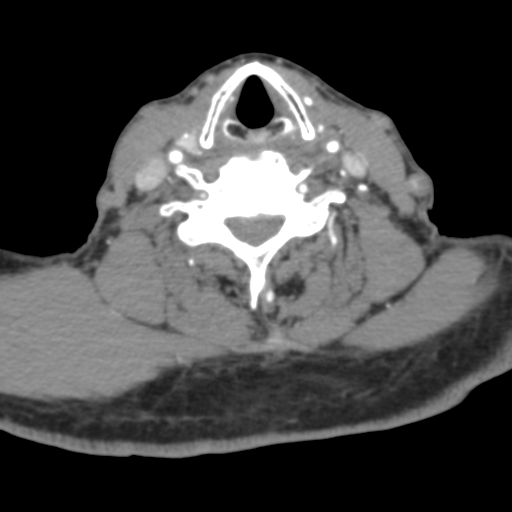
[im 126/158  soft-tissue]
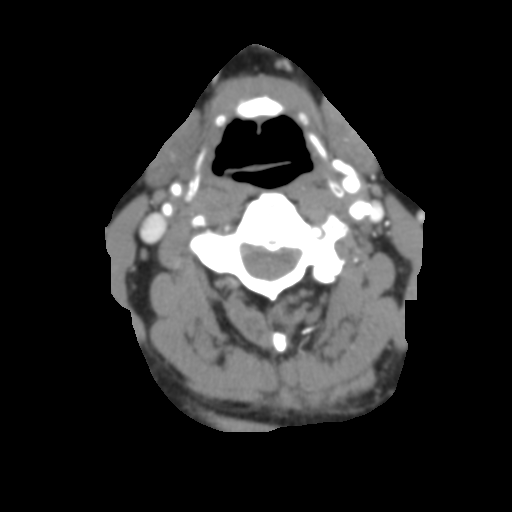

[sagittals · sagittal · 0.57mm/px · 1 of 78 slices shown]
[im 39/78  soft-tissue]
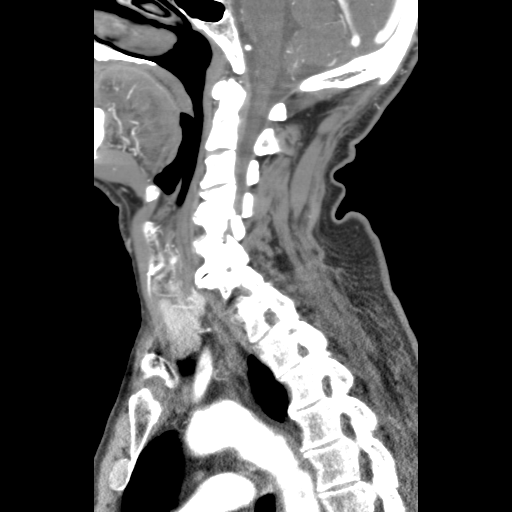

[8 of 33 positions shown; findings below may reference images not displayed]

FINDINGS: The innominate artery is occluded at the origin.  There
is reconstitution of the right common carotid and right subclavian
artery via collaterals.  The proximal left common carotid artery is
patent.  The proximal left subclavian artery is occluded at the
origin.  Note that the patient had  retrograde flow in the left
vertebral artery on the Doppler study compatible with subclavian
steal.

The right common carotid artery is smaller than the  left common
carotid artery due to decreased flow from occlusion of the
innominate artery.  There is no significant atherosclerotic disease
in the right common carotid artery.  There is atherosclerotic
plaque which is calcified and noncalcified involving the carotid
bifurcation.  20% diameter stenosis of the proximal right internal
carotid artery.  The right internal carotid artery  has  decreased
caliber due to decreased  flow.

The left common carotid artery is patent proximally.  There is
noncalcified atherosclerotic plaque in the mid to distal common
carotid artery.  This narrows the lumen by 65% diameter stenosis.
There is a calcified and noncalcified plaque at the carotid
bifurcation.  2 cm above bifurcation is a tight focal stenosis
narrowing the lumen by 80% diameter stenosis.  The remainder of the
left internal carotid artery is patent to the skull base.

The right vertebral artery is quite small.  The left vertebral
artery is widely patent but is also smaller than average, and had
retrograde flow by Doppler.

Apical emphysema is present bilaterally.  Cervical disc
degeneration and moderate to extensive spondylosis at C4-5, C5-6,
and C6-7.

 Review of the MIP images confirms the above findings.
IMPRESSION: There is occlusion of the innominate artery.  Decreased caliber of
the right common carotid artery due to decreased flow.  Mild
atherosclerotic disease of the right carotid bifurcation.

Occlusion of the proximal left subclavian artery.

Atherosclerotic plaque in the left common carotid artery narrowing
the lumen by 65% diameter stenosis.

Focal 80% diameter stenosis of the proximal left internal carotid
artery.

Small left vertebral artery bilaterally  with left subclavian steal
by Doppler.

## 2014-09-28 DIAGNOSIS — E782 Mixed hyperlipidemia: Secondary | ICD-10-CM | POA: Diagnosis not present

## 2014-09-28 DIAGNOSIS — R7301 Impaired fasting glucose: Secondary | ICD-10-CM | POA: Diagnosis not present

## 2014-10-01 DIAGNOSIS — Z6823 Body mass index (BMI) 23.0-23.9, adult: Secondary | ICD-10-CM | POA: Diagnosis not present

## 2014-10-01 DIAGNOSIS — R809 Proteinuria, unspecified: Secondary | ICD-10-CM | POA: Diagnosis not present

## 2014-10-01 DIAGNOSIS — E782 Mixed hyperlipidemia: Secondary | ICD-10-CM | POA: Diagnosis not present

## 2014-10-01 DIAGNOSIS — R7301 Impaired fasting glucose: Secondary | ICD-10-CM | POA: Diagnosis not present

## 2014-10-14 ENCOUNTER — Encounter: Payer: Self-pay | Admitting: Vascular Surgery

## 2014-10-14 ENCOUNTER — Other Ambulatory Visit: Payer: Self-pay | Admitting: *Deleted

## 2014-10-14 DIAGNOSIS — I6523 Occlusion and stenosis of bilateral carotid arteries: Secondary | ICD-10-CM

## 2014-11-30 ENCOUNTER — Encounter: Payer: Self-pay | Admitting: Vascular Surgery

## 2014-12-02 ENCOUNTER — Ambulatory Visit (HOSPITAL_COMMUNITY)
Admission: RE | Admit: 2014-12-02 | Discharge: 2014-12-02 | Disposition: A | Discharge status: Home / Self Care | Payer: Medicare Other | Source: Ambulatory Visit | Attending: Vascular Surgery | Admitting: Vascular Surgery

## 2014-12-02 ENCOUNTER — Ambulatory Visit (INDEPENDENT_AMBULATORY_CARE_PROVIDER_SITE_OTHER): Payer: Medicare Other | Admitting: Vascular Surgery

## 2014-12-02 ENCOUNTER — Encounter: Payer: Self-pay | Admitting: Vascular Surgery

## 2014-12-02 VITALS — BP 104/71 | HR 59 | Ht 73.0 in | Wt 172.3 lb

## 2014-12-02 DIAGNOSIS — I6523 Occlusion and stenosis of bilateral carotid arteries: Secondary | ICD-10-CM | POA: Diagnosis not present

## 2014-12-02 DIAGNOSIS — Z48812 Encounter for surgical aftercare following surgery on the circulatory system: Secondary | ICD-10-CM

## 2014-12-02 DIAGNOSIS — I6522 Occlusion and stenosis of left carotid artery: Secondary | ICD-10-CM | POA: Diagnosis not present

## 2014-12-02 NOTE — Progress Notes (Signed)
[facility]  Patient name: Zachary Edwards MRN: [id number] DOB: 10-20-1945 Sex: male  REASON FOR CONSULT: history of carotid disease referred by Dr. Catalina Pizza.  HPI: Zachary Edwards is a 69 y.o. male who underwent a left carotid endarterectomy by Dr. Darrick Penna in 2007. I do not have the records from back then, but he tells me that he had an intracranial bleed postoperatively. The best I can tell he had some type of a reperfusion injury. However he has done well since that time but was lost to follow up. He denies any recent history of stroke, TIAs, expressive or receptive aphasia, or amaurosis fugax. He is right-handed.  I have reviewed the records that were sent with him. He is on aspirin and is on a statin. He did have some proteinuria recently and is set up to see a nephrologist.  Past Medical History  Diagnosis Date  . Hypertension   . Hyperlipidemia   . Carotid artery occlusion   . COPD (chronic obstructive pulmonary disease)   . Stroke    Family History  Problem Relation Age of Onset  . Cancer Mother    SOCIAL HISTORY: History  Substance Use Topics  . Smoking status: Former Smoker    Quit date: 09/17/2009  . Smokeless tobacco: Not on file  . Alcohol Use: No   Allergies  Allergen Reactions  . Penicillins    Current Outpatient Prescriptions  Medication Sig Dispense Refill  . aspirin 81 MG tablet Take 81 mg by mouth daily.    . pravastatin (PRAVACHOL) 20 MG tablet Take 40 mg by mouth daily.     Marland Kitchen amLODipine (NORVASC) 10 MG tablet Take 10 mg by mouth daily.    . hydrochlorothiazide (HYDRODIURIL) 25 MG tablet Take 25 mg by mouth daily.    Marland Kitchen levETIRAcetam (KEPPRA) 1000 MG tablet Take 1,000 mg by mouth 2 (two) times daily.     No current facility-administered medications for this visit.   REVIEW OF SYSTEMS: Arly.Keller ] denotes positive finding; [  ] denotes negative finding  CARDIOVASCULAR:   chest pain    chest pressure    palpitations   [  ] orthopnea    dyspnea on exertion    claudication    rest pain    DVT    phlebitis PULMONARY:    productive cough    asthma    wheezing NEUROLOGIC:    weakness   paresthesias   aphasia   amaurosis   dizziness HEMATOLOGIC:    bleeding problems    clotting disorders MUSCULOSKELETAL:   joint pain    joint swelling  leg swelling GASTROINTESTINAL:   blood in stool    hematemesis GENITOURINARY:    dysuria    hematuria PSYCHIATRIC:   history of major depression INTEGUMENTARY:   rashes   ulcers CONSTITUTIONAL:   fever    chills  PHYSICAL EXAM: Filed Vitals:   12/02/14 1007 12/02/14 1008  BP: 108/74 104/71  Pulse: 59   Height:  (1.854 m)   Weight: 172 lb 4.8 oz (78.155 kg)   SpO2: 96%    GENERAL: The patient is a well-nourished male, in no acute distress. The vital signs are documented above. CARDIOVASCULAR: There is a regular rate and rhythm. He has bilateral carotid bruits. I cannot palpate  radial pulses. He does have palpable posterior tibial pulses bilaterally. He has no significant lower extremity swelling. PULMONARY: There is good air exchange bilaterally without wheezing or rales. ABDOMEN: Soft and non-tender with normal pitched bowel sounds.  MUSCULOSKELETAL: There are no major deformities or cyanosis. NEUROLOGIC: No focal weakness or paresthesias are detected. SKIN: There are no ulcers or rashes noted. PSYCHIATRIC: The patient has a normal affect.  DATA:  I have independently interpreted his carotid duplex scan from today. This shows no significant stenosis in the right internal carotid artery. The left carotid endarterectomy site is widely patent. He has known disease of the left subclavian and right subclavian arteries.  MEDICAL ISSUES:  STATUS POST LEFT CAROTID ENDARTERECTOMY: tthere is no evidence of recurrent left carotid stenosis. He does have some disease in the common carotid artery  proximally. There is no significant disease in the right internal carotid artery. I have ordered a follow up carotid duplex scan in 1 year and I will see him back at that time. He is on aspirin. He is on a statin. He is not a smoker. He wanted to get off his cholesterol medicine but I did explain that the vascular data suggests that vascular patients on aspirin and statin to have a significantly lower risk of heart attack and stroke.  Waverly Ferrari [facility] [place] Beeper: [phone]

## 2014-12-02 NOTE — Addendum Note (Signed)
Addended by: Adria Dill L on: 12/02/2014 01:45 PM   Modules accepted: Orders

## 2014-12-29 DIAGNOSIS — R809 Proteinuria, unspecified: Secondary | ICD-10-CM | POA: Diagnosis not present

## 2014-12-29 DIAGNOSIS — I2510 Atherosclerotic heart disease of native coronary artery without angina pectoris: Secondary | ICD-10-CM | POA: Diagnosis not present

## 2014-12-29 DIAGNOSIS — N189 Chronic kidney disease, unspecified: Secondary | ICD-10-CM | POA: Diagnosis not present

## 2015-01-12 ENCOUNTER — Other Ambulatory Visit (HOSPITAL_COMMUNITY): Payer: Self-pay | Admitting: Nephrology

## 2015-01-12 DIAGNOSIS — N183 Chronic kidney disease, stage 3 unspecified: Secondary | ICD-10-CM

## 2015-01-12 DIAGNOSIS — I129 Hypertensive chronic kidney disease with stage 1 through stage 4 chronic kidney disease, or unspecified chronic kidney disease: Secondary | ICD-10-CM

## 2015-01-22 ENCOUNTER — Ambulatory Visit (HOSPITAL_COMMUNITY)
Admission: RE | Admit: 2015-01-22 | Discharge: 2015-01-22 | Disposition: A | Discharge status: Home / Self Care | Payer: Medicare Other | Source: Ambulatory Visit | Attending: Nephrology | Admitting: Nephrology

## 2015-01-22 DIAGNOSIS — I10 Essential (primary) hypertension: Secondary | ICD-10-CM | POA: Diagnosis not present

## 2015-01-22 DIAGNOSIS — I129 Hypertensive chronic kidney disease with stage 1 through stage 4 chronic kidney disease, or unspecified chronic kidney disease: Secondary | ICD-10-CM | POA: Diagnosis not present

## 2015-01-22 DIAGNOSIS — N183 Chronic kidney disease, stage 3 (moderate): Secondary | ICD-10-CM | POA: Diagnosis not present

## 2015-01-22 DIAGNOSIS — D509 Iron deficiency anemia, unspecified: Secondary | ICD-10-CM | POA: Diagnosis not present

## 2015-01-22 DIAGNOSIS — Z79899 Other long term (current) drug therapy: Secondary | ICD-10-CM | POA: Diagnosis not present

## 2015-01-22 DIAGNOSIS — R809 Proteinuria, unspecified: Secondary | ICD-10-CM | POA: Diagnosis not present

## 2015-01-22 DIAGNOSIS — E559 Vitamin D deficiency, unspecified: Secondary | ICD-10-CM | POA: Diagnosis not present

## 2015-01-22 DIAGNOSIS — N189 Chronic kidney disease, unspecified: Secondary | ICD-10-CM | POA: Diagnosis not present

## 2015-02-09 DIAGNOSIS — N281 Cyst of kidney, acquired: Secondary | ICD-10-CM | POA: Diagnosis not present

## 2015-02-09 DIAGNOSIS — R809 Proteinuria, unspecified: Secondary | ICD-10-CM | POA: Diagnosis not present

## 2015-02-09 DIAGNOSIS — N182 Chronic kidney disease, stage 2 (mild): Secondary | ICD-10-CM | POA: Diagnosis not present

## 2015-02-09 DIAGNOSIS — E559 Vitamin D deficiency, unspecified: Secondary | ICD-10-CM | POA: Diagnosis not present

## 2015-02-24 ENCOUNTER — Other Ambulatory Visit (HOSPITAL_COMMUNITY): Payer: Self-pay | Admitting: Nephrology

## 2015-02-24 DIAGNOSIS — N281 Cyst of kidney, acquired: Secondary | ICD-10-CM

## 2015-03-01 ENCOUNTER — Ambulatory Visit (HOSPITAL_COMMUNITY): Payer: Medicare Other

## 2015-03-08 DIAGNOSIS — E782 Mixed hyperlipidemia: Secondary | ICD-10-CM | POA: Diagnosis not present

## 2015-03-08 DIAGNOSIS — R7301 Impaired fasting glucose: Secondary | ICD-10-CM | POA: Diagnosis not present

## 2015-03-08 DIAGNOSIS — Z125 Encounter for screening for malignant neoplasm of prostate: Secondary | ICD-10-CM | POA: Diagnosis not present

## 2015-03-15 DIAGNOSIS — R809 Proteinuria, unspecified: Secondary | ICD-10-CM | POA: Diagnosis not present

## 2015-03-15 DIAGNOSIS — R7301 Impaired fasting glucose: Secondary | ICD-10-CM | POA: Diagnosis not present

## 2015-03-15 DIAGNOSIS — E782 Mixed hyperlipidemia: Secondary | ICD-10-CM | POA: Diagnosis not present

## 2015-08-23 DIAGNOSIS — E782 Mixed hyperlipidemia: Secondary | ICD-10-CM | POA: Diagnosis not present

## 2015-08-23 DIAGNOSIS — I10 Essential (primary) hypertension: Secondary | ICD-10-CM | POA: Diagnosis not present

## 2015-08-23 DIAGNOSIS — R7301 Impaired fasting glucose: Secondary | ICD-10-CM | POA: Diagnosis not present

## 2015-08-26 DIAGNOSIS — R809 Proteinuria, unspecified: Secondary | ICD-10-CM | POA: Diagnosis not present

## 2015-08-26 DIAGNOSIS — I6529 Occlusion and stenosis of unspecified carotid artery: Secondary | ICD-10-CM | POA: Diagnosis not present

## 2015-08-26 DIAGNOSIS — E559 Vitamin D deficiency, unspecified: Secondary | ICD-10-CM | POA: Diagnosis not present

## 2015-08-26 DIAGNOSIS — E782 Mixed hyperlipidemia: Secondary | ICD-10-CM | POA: Diagnosis not present

## 2015-08-26 DIAGNOSIS — R7301 Impaired fasting glucose: Secondary | ICD-10-CM | POA: Diagnosis not present

## 2015-12-08 ENCOUNTER — Encounter (HOSPITAL_COMMUNITY): Payer: Self-pay

## 2015-12-08 ENCOUNTER — Ambulatory Visit: Payer: Self-pay | Admitting: Vascular Surgery

## 2016-02-28 DIAGNOSIS — E782 Mixed hyperlipidemia: Secondary | ICD-10-CM | POA: Diagnosis not present

## 2016-02-28 DIAGNOSIS — E559 Vitamin D deficiency, unspecified: Secondary | ICD-10-CM | POA: Diagnosis not present

## 2016-02-28 DIAGNOSIS — R7301 Impaired fasting glucose: Secondary | ICD-10-CM | POA: Diagnosis not present

## 2016-03-02 DIAGNOSIS — E782 Mixed hyperlipidemia: Secondary | ICD-10-CM | POA: Diagnosis not present

## 2016-03-02 DIAGNOSIS — E559 Vitamin D deficiency, unspecified: Secondary | ICD-10-CM | POA: Diagnosis not present

## 2016-03-02 DIAGNOSIS — I6529 Occlusion and stenosis of unspecified carotid artery: Secondary | ICD-10-CM | POA: Diagnosis not present

## 2016-03-02 DIAGNOSIS — R7303 Prediabetes: Secondary | ICD-10-CM | POA: Diagnosis not present

## 2016-03-02 DIAGNOSIS — R809 Proteinuria, unspecified: Secondary | ICD-10-CM | POA: Diagnosis not present

## 2016-06-02 DIAGNOSIS — Z0000 Encounter for general adult medical examination without abnormal findings: Secondary | ICD-10-CM | POA: Diagnosis not present

## 2016-08-28 DIAGNOSIS — I10 Essential (primary) hypertension: Secondary | ICD-10-CM | POA: Diagnosis not present

## 2016-08-28 DIAGNOSIS — E559 Vitamin D deficiency, unspecified: Secondary | ICD-10-CM | POA: Diagnosis not present

## 2016-08-28 DIAGNOSIS — Z1159 Encounter for screening for other viral diseases: Secondary | ICD-10-CM | POA: Diagnosis not present

## 2016-08-28 DIAGNOSIS — R7301 Impaired fasting glucose: Secondary | ICD-10-CM | POA: Diagnosis not present

## 2016-08-31 DIAGNOSIS — E559 Vitamin D deficiency, unspecified: Secondary | ICD-10-CM | POA: Diagnosis not present

## 2016-08-31 DIAGNOSIS — R7303 Prediabetes: Secondary | ICD-10-CM | POA: Diagnosis not present

## 2016-08-31 DIAGNOSIS — R809 Proteinuria, unspecified: Secondary | ICD-10-CM | POA: Diagnosis not present

## 2016-08-31 DIAGNOSIS — I6529 Occlusion and stenosis of unspecified carotid artery: Secondary | ICD-10-CM | POA: Diagnosis not present

## 2016-08-31 DIAGNOSIS — E782 Mixed hyperlipidemia: Secondary | ICD-10-CM | POA: Diagnosis not present

## 2016-10-22 IMAGING — US US RENAL
1 series · 14 of 25 positions shown · non-contrast
Comparison: None.

CLINICAL DATA: Hypertension.  Chronic renal disease.

EXAM:
RENAL / URINARY TRACT ULTRASOUND COMPLETE

[Series 1: us renal · 0.22mm/px · 14 of 41 slices shown]
[im 1/41]
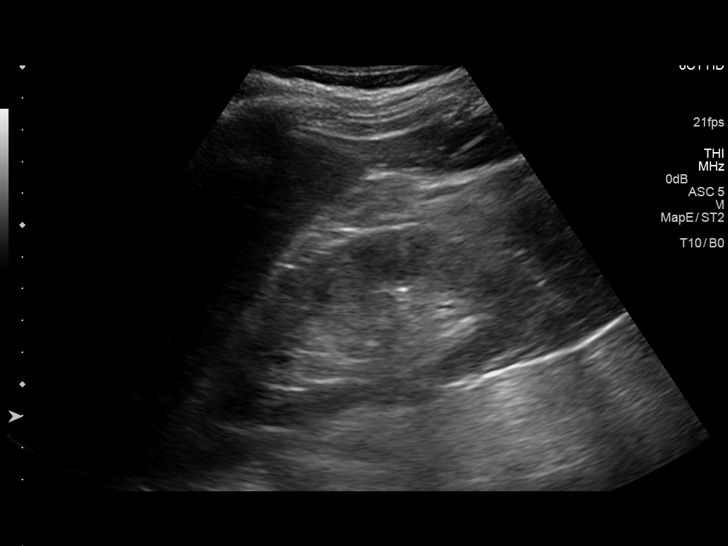
[im 4/41]
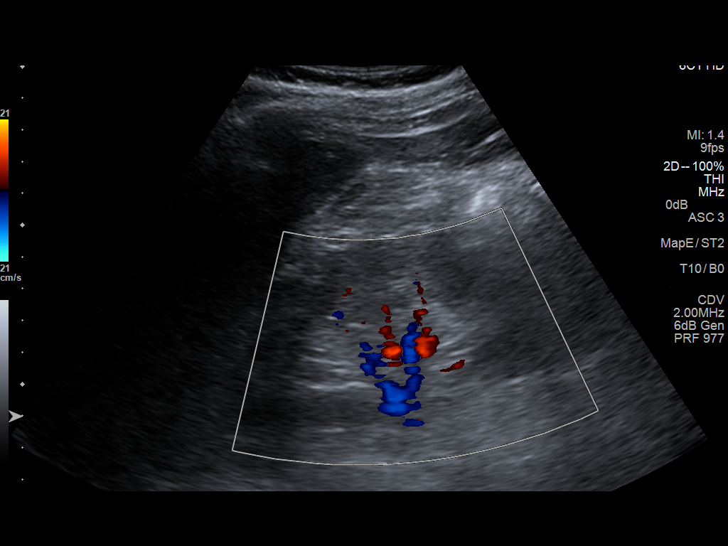
[im 7/41]
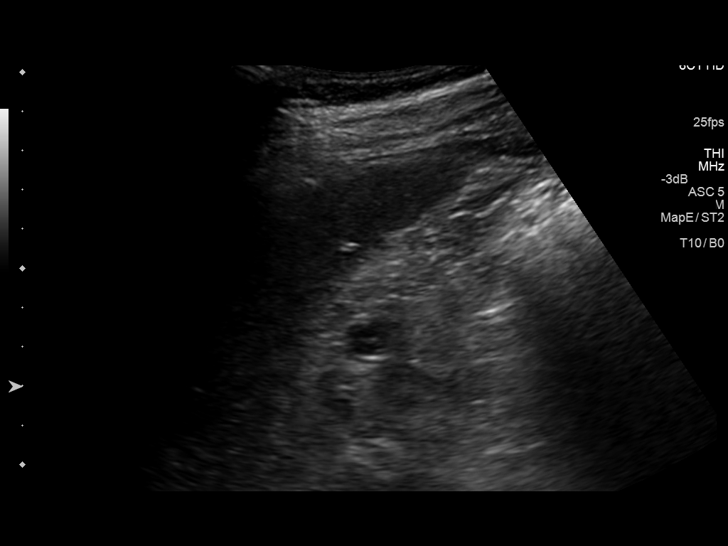
[im 11/41]
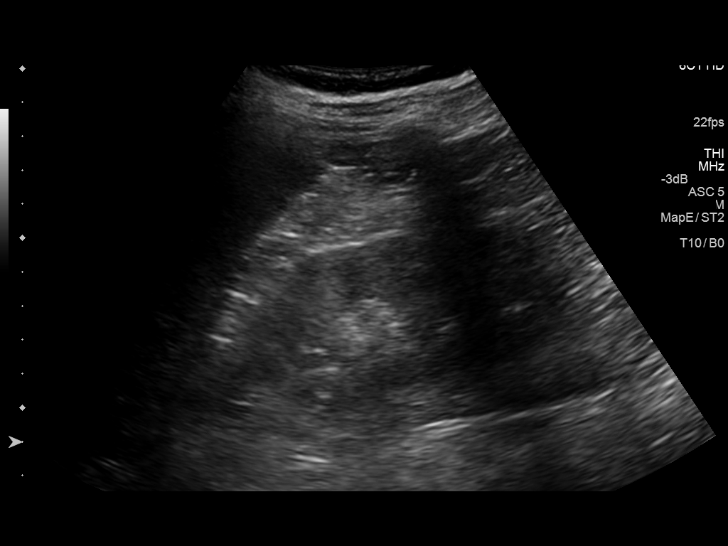
[im 14/41]
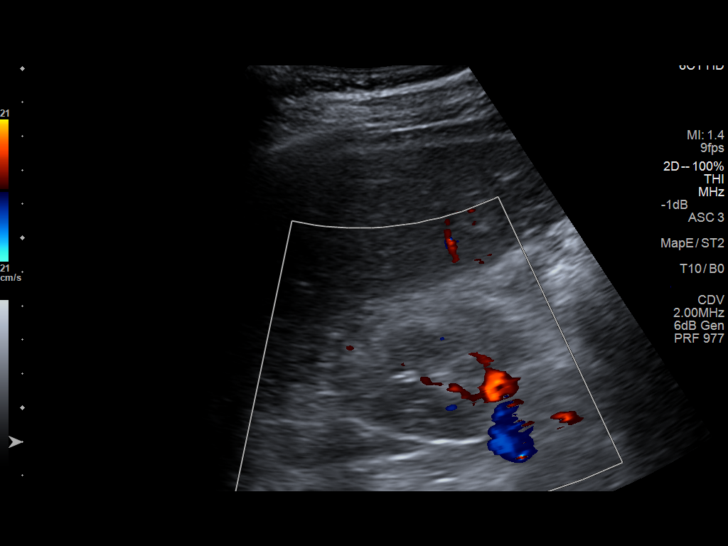
[im 16/41]
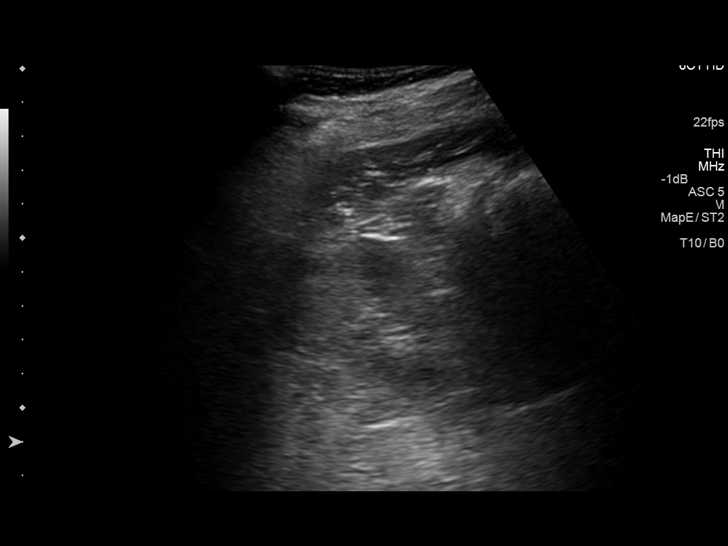
[im 19/41]
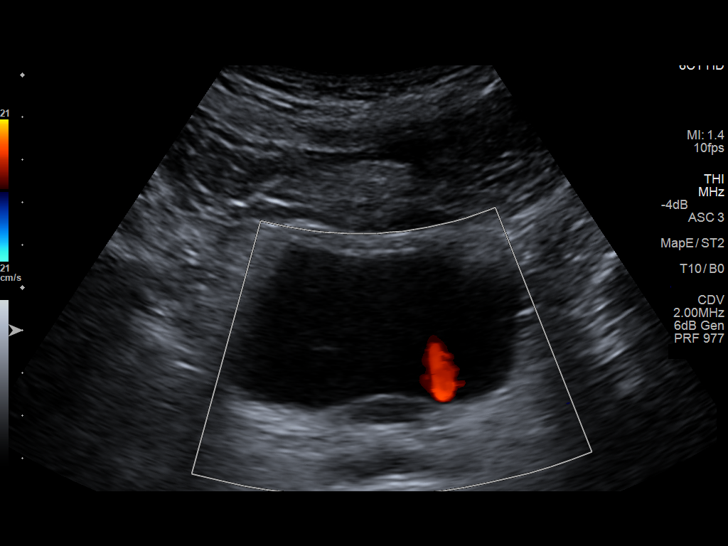
[im 22/41]
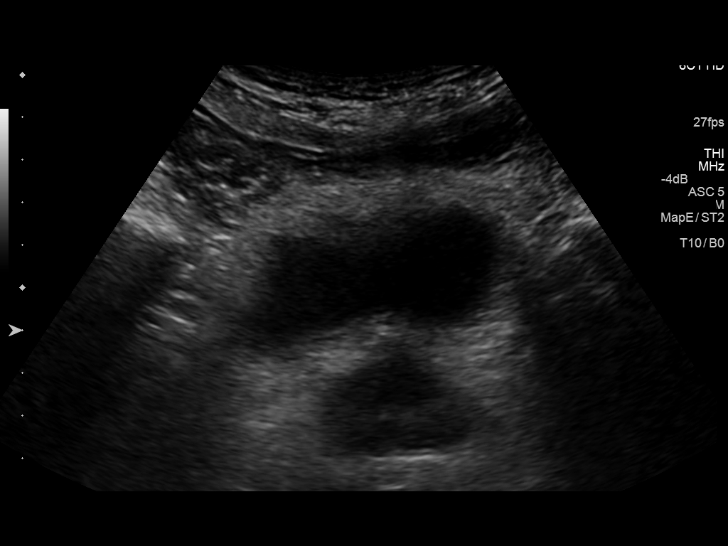
[im 26/41]
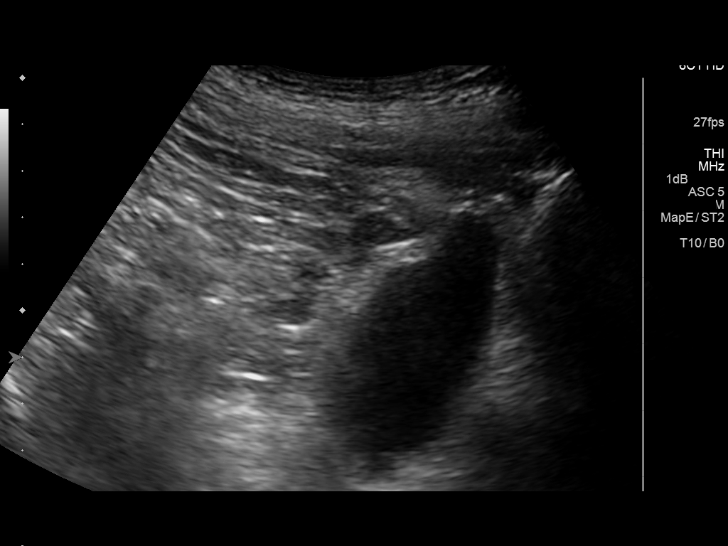
[im 27/41]
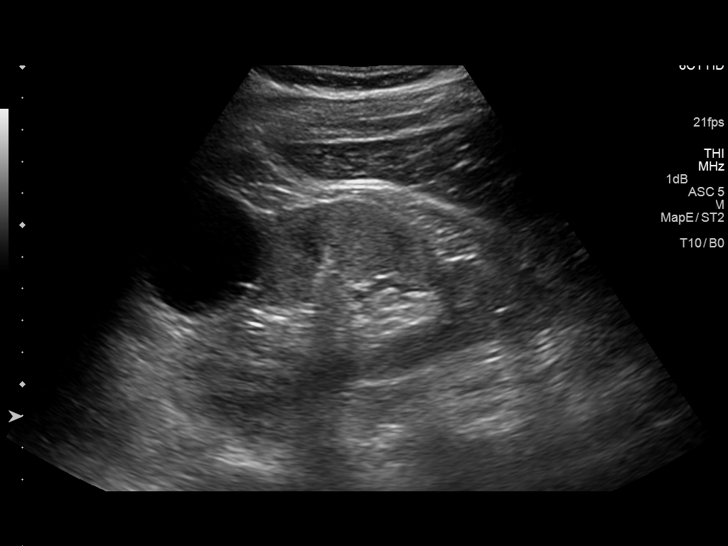
[im 31/41]
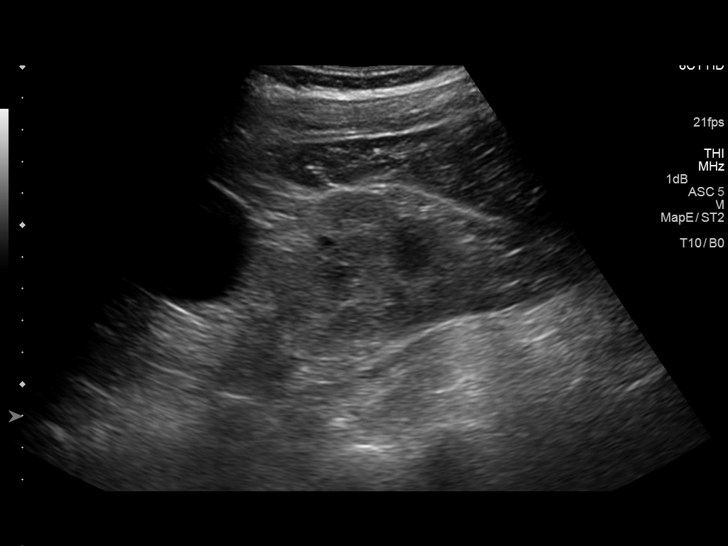
[im 34/41]
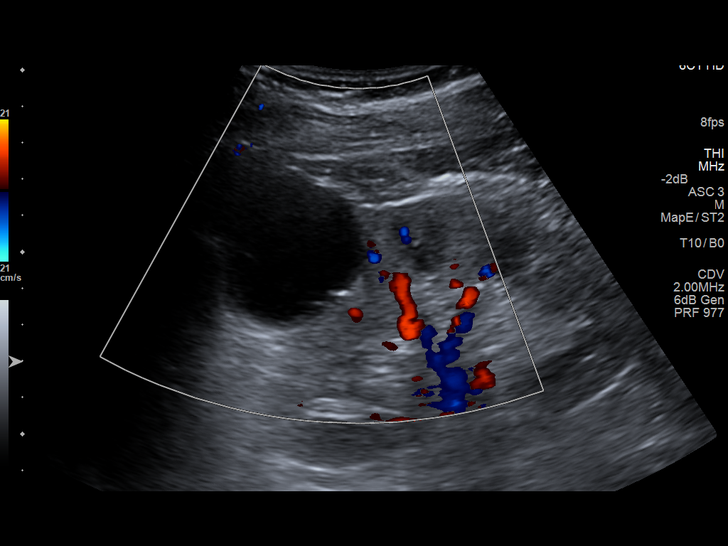
[im 37/41]
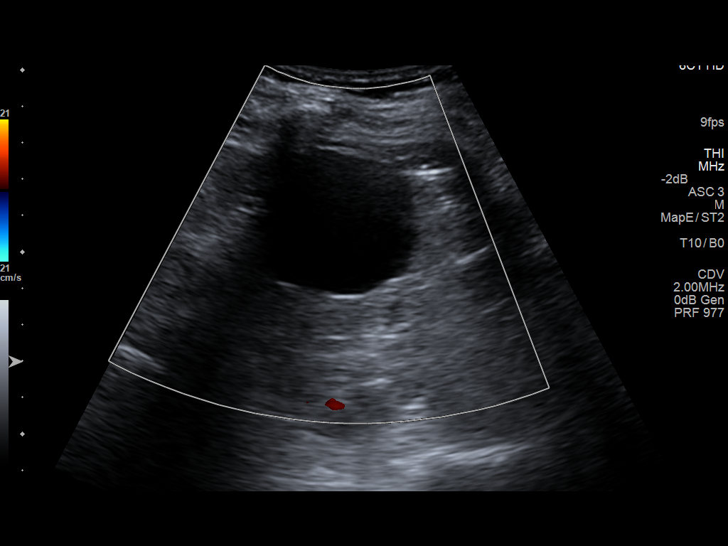
[im 41/41]
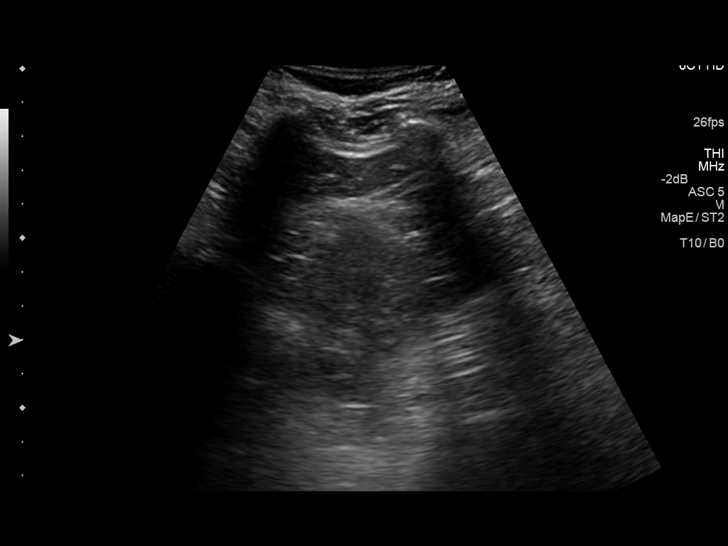

[14 of 25 positions shown; findings below may reference images not displayed]

FINDINGS: Right Kidney:

Length: 11.6 cm. Kidney is echogenic consistent chronic medical
renal disease. 1.3 cm complex cyst midportion right kidney. No
hydronephrosis.

Left Kidney:

Length: 12.1 cm. Kidney is echogenic consistent chronic medical
renal disease. No hydronephrosis visualized. 4.5 cm simple cyst
upper pole left kidney.

Bladder:

Mildly thickened bladder wall. Active bladder pathology or
hypertrophy cannot be excluded . Prostate is slightly prominent.
IMPRESSION: 1. Kidneys are echogenic consistent with chronic medical renal
disease. 1.3 cm complex cyst is in the midportion of the right
kidney. MRI of the kidneys can be obtained for further evaluation.
4.5 cm simple cyst left kidney noted.

2. Mild prostate prominence. There is mild thickening of the
posterior bladder wall. Active bladder pathology and/or bladder wall
hypertrophy cannot be excluded .

## 2017-03-02 DIAGNOSIS — I10 Essential (primary) hypertension: Secondary | ICD-10-CM | POA: Diagnosis not present

## 2017-03-02 DIAGNOSIS — E559 Vitamin D deficiency, unspecified: Secondary | ICD-10-CM | POA: Diagnosis not present

## 2017-03-02 DIAGNOSIS — R7301 Impaired fasting glucose: Secondary | ICD-10-CM | POA: Diagnosis not present

## 2017-03-15 DIAGNOSIS — R944 Abnormal results of kidney function studies: Secondary | ICD-10-CM | POA: Diagnosis not present

## 2017-03-15 DIAGNOSIS — R809 Proteinuria, unspecified: Secondary | ICD-10-CM | POA: Diagnosis not present

## 2017-03-15 DIAGNOSIS — I6529 Occlusion and stenosis of unspecified carotid artery: Secondary | ICD-10-CM | POA: Diagnosis not present

## 2017-03-15 DIAGNOSIS — R7303 Prediabetes: Secondary | ICD-10-CM | POA: Diagnosis not present

## 2017-03-15 DIAGNOSIS — E559 Vitamin D deficiency, unspecified: Secondary | ICD-10-CM | POA: Diagnosis not present

## 2017-09-12 DIAGNOSIS — Z125 Encounter for screening for malignant neoplasm of prostate: Secondary | ICD-10-CM | POA: Diagnosis not present

## 2017-09-12 DIAGNOSIS — R7303 Prediabetes: Secondary | ICD-10-CM | POA: Diagnosis not present

## 2017-09-12 DIAGNOSIS — E559 Vitamin D deficiency, unspecified: Secondary | ICD-10-CM | POA: Diagnosis not present

## 2017-09-12 DIAGNOSIS — I10 Essential (primary) hypertension: Secondary | ICD-10-CM | POA: Diagnosis not present

## 2017-09-12 DIAGNOSIS — E782 Mixed hyperlipidemia: Secondary | ICD-10-CM | POA: Diagnosis not present

## 2017-09-14 DIAGNOSIS — R809 Proteinuria, unspecified: Secondary | ICD-10-CM | POA: Diagnosis not present

## 2017-09-14 DIAGNOSIS — R7303 Prediabetes: Secondary | ICD-10-CM | POA: Diagnosis not present

## 2017-09-14 DIAGNOSIS — I2510 Atherosclerotic heart disease of native coronary artery without angina pectoris: Secondary | ICD-10-CM | POA: Diagnosis not present

## 2017-09-14 DIAGNOSIS — E782 Mixed hyperlipidemia: Secondary | ICD-10-CM | POA: Diagnosis not present

## 2017-09-14 DIAGNOSIS — R944 Abnormal results of kidney function studies: Secondary | ICD-10-CM | POA: Diagnosis not present

## 2017-09-24 DIAGNOSIS — B353 Tinea pedis: Secondary | ICD-10-CM | POA: Diagnosis not present

## 2017-09-24 DIAGNOSIS — X32XXXA Exposure to sunlight, initial encounter: Secondary | ICD-10-CM | POA: Diagnosis not present

## 2017-09-24 DIAGNOSIS — L308 Other specified dermatitis: Secondary | ICD-10-CM | POA: Diagnosis not present

## 2017-09-24 DIAGNOSIS — L570 Actinic keratosis: Secondary | ICD-10-CM | POA: Diagnosis not present

## 2018-03-08 DIAGNOSIS — R944 Abnormal results of kidney function studies: Secondary | ICD-10-CM | POA: Diagnosis not present

## 2018-03-08 DIAGNOSIS — E559 Vitamin D deficiency, unspecified: Secondary | ICD-10-CM | POA: Diagnosis not present

## 2018-03-08 DIAGNOSIS — E782 Mixed hyperlipidemia: Secondary | ICD-10-CM | POA: Diagnosis not present

## 2018-03-08 DIAGNOSIS — R7303 Prediabetes: Secondary | ICD-10-CM | POA: Diagnosis not present

## 2018-03-19 DIAGNOSIS — N183 Chronic kidney disease, stage 3 (moderate): Secondary | ICD-10-CM | POA: Diagnosis not present

## 2018-03-19 DIAGNOSIS — R7303 Prediabetes: Secondary | ICD-10-CM | POA: Diagnosis not present

## 2018-03-19 DIAGNOSIS — E559 Vitamin D deficiency, unspecified: Secondary | ICD-10-CM | POA: Diagnosis not present

## 2018-03-19 DIAGNOSIS — E782 Mixed hyperlipidemia: Secondary | ICD-10-CM | POA: Diagnosis not present

## 2018-03-19 DIAGNOSIS — I6529 Occlusion and stenosis of unspecified carotid artery: Secondary | ICD-10-CM | POA: Diagnosis not present

## 2018-03-19 DIAGNOSIS — Z0000 Encounter for general adult medical examination without abnormal findings: Secondary | ICD-10-CM | POA: Diagnosis not present

## 2018-09-24 DIAGNOSIS — I6529 Occlusion and stenosis of unspecified carotid artery: Secondary | ICD-10-CM | POA: Diagnosis not present

## 2018-09-24 DIAGNOSIS — I2510 Atherosclerotic heart disease of native coronary artery without angina pectoris: Secondary | ICD-10-CM | POA: Diagnosis not present

## 2018-09-24 DIAGNOSIS — E782 Mixed hyperlipidemia: Secondary | ICD-10-CM | POA: Diagnosis not present

## 2018-09-24 DIAGNOSIS — E559 Vitamin D deficiency, unspecified: Secondary | ICD-10-CM | POA: Diagnosis not present

## 2018-09-24 DIAGNOSIS — R7303 Prediabetes: Secondary | ICD-10-CM | POA: Diagnosis not present

## 2018-10-01 DIAGNOSIS — R7303 Prediabetes: Secondary | ICD-10-CM | POA: Diagnosis not present

## 2018-10-01 DIAGNOSIS — N183 Chronic kidney disease, stage 3 (moderate): Secondary | ICD-10-CM | POA: Diagnosis not present

## 2018-10-01 DIAGNOSIS — R809 Proteinuria, unspecified: Secondary | ICD-10-CM | POA: Diagnosis not present

## 2018-10-01 DIAGNOSIS — E782 Mixed hyperlipidemia: Secondary | ICD-10-CM | POA: Diagnosis not present

## 2018-10-01 DIAGNOSIS — I6529 Occlusion and stenosis of unspecified carotid artery: Secondary | ICD-10-CM | POA: Diagnosis not present

## 2018-10-25 DIAGNOSIS — Z0000 Encounter for general adult medical examination without abnormal findings: Secondary | ICD-10-CM | POA: Diagnosis not present

## 2019-03-31 DIAGNOSIS — E559 Vitamin D deficiency, unspecified: Secondary | ICD-10-CM | POA: Diagnosis not present

## 2019-03-31 DIAGNOSIS — R7303 Prediabetes: Secondary | ICD-10-CM | POA: Diagnosis not present

## 2019-03-31 DIAGNOSIS — E782 Mixed hyperlipidemia: Secondary | ICD-10-CM | POA: Diagnosis not present

## 2019-03-31 DIAGNOSIS — R944 Abnormal results of kidney function studies: Secondary | ICD-10-CM | POA: Diagnosis not present

## 2019-04-03 DIAGNOSIS — I6529 Occlusion and stenosis of unspecified carotid artery: Secondary | ICD-10-CM | POA: Diagnosis not present

## 2019-04-03 DIAGNOSIS — N1831 Chronic kidney disease, stage 3a: Secondary | ICD-10-CM | POA: Diagnosis not present

## 2019-04-03 DIAGNOSIS — E782 Mixed hyperlipidemia: Secondary | ICD-10-CM | POA: Diagnosis not present

## 2019-04-03 DIAGNOSIS — R809 Proteinuria, unspecified: Secondary | ICD-10-CM | POA: Diagnosis not present

## 2019-04-03 DIAGNOSIS — R7303 Prediabetes: Secondary | ICD-10-CM | POA: Diagnosis not present

## 2019-10-06 DIAGNOSIS — I2510 Atherosclerotic heart disease of native coronary artery without angina pectoris: Secondary | ICD-10-CM | POA: Diagnosis not present

## 2019-10-06 DIAGNOSIS — E782 Mixed hyperlipidemia: Secondary | ICD-10-CM | POA: Diagnosis not present

## 2019-10-06 DIAGNOSIS — R7301 Impaired fasting glucose: Secondary | ICD-10-CM | POA: Diagnosis not present

## 2019-10-06 DIAGNOSIS — E559 Vitamin D deficiency, unspecified: Secondary | ICD-10-CM | POA: Diagnosis not present

## 2019-10-06 DIAGNOSIS — L309 Dermatitis, unspecified: Secondary | ICD-10-CM | POA: Diagnosis not present

## 2019-10-06 DIAGNOSIS — I6529 Occlusion and stenosis of unspecified carotid artery: Secondary | ICD-10-CM | POA: Diagnosis not present

## 2019-10-09 DIAGNOSIS — N1831 Chronic kidney disease, stage 3a: Secondary | ICD-10-CM | POA: Diagnosis not present

## 2019-10-09 DIAGNOSIS — R7303 Prediabetes: Secondary | ICD-10-CM | POA: Diagnosis not present

## 2019-10-09 DIAGNOSIS — Z0001 Encounter for general adult medical examination with abnormal findings: Secondary | ICD-10-CM | POA: Diagnosis not present

## 2019-10-09 DIAGNOSIS — R809 Proteinuria, unspecified: Secondary | ICD-10-CM | POA: Diagnosis not present

## 2019-10-09 DIAGNOSIS — E782 Mixed hyperlipidemia: Secondary | ICD-10-CM | POA: Diagnosis not present

## 2019-10-09 DIAGNOSIS — I6529 Occlusion and stenosis of unspecified carotid artery: Secondary | ICD-10-CM | POA: Diagnosis not present

## 2020-04-12 DIAGNOSIS — I2510 Atherosclerotic heart disease of native coronary artery without angina pectoris: Secondary | ICD-10-CM | POA: Diagnosis not present

## 2020-04-12 DIAGNOSIS — S60459A Superficial foreign body of unspecified finger, initial encounter: Secondary | ICD-10-CM | POA: Diagnosis not present

## 2020-04-12 DIAGNOSIS — R7303 Prediabetes: Secondary | ICD-10-CM | POA: Diagnosis not present

## 2020-04-12 DIAGNOSIS — Z712 Person consulting for explanation of examination or test findings: Secondary | ICD-10-CM | POA: Diagnosis not present

## 2020-04-12 DIAGNOSIS — Z2821 Immunization not carried out because of patient refusal: Secondary | ICD-10-CM | POA: Diagnosis not present

## 2020-04-12 DIAGNOSIS — Z0001 Encounter for general adult medical examination with abnormal findings: Secondary | ICD-10-CM | POA: Diagnosis not present

## 2020-04-12 DIAGNOSIS — N1831 Chronic kidney disease, stage 3a: Secondary | ICD-10-CM | POA: Diagnosis not present

## 2020-04-15 DIAGNOSIS — E782 Mixed hyperlipidemia: Secondary | ICD-10-CM | POA: Diagnosis not present

## 2020-04-15 DIAGNOSIS — I6529 Occlusion and stenosis of unspecified carotid artery: Secondary | ICD-10-CM | POA: Diagnosis not present

## 2020-04-15 DIAGNOSIS — N1831 Chronic kidney disease, stage 3a: Secondary | ICD-10-CM | POA: Diagnosis not present

## 2020-04-15 DIAGNOSIS — R7303 Prediabetes: Secondary | ICD-10-CM | POA: Diagnosis not present

## 2020-04-15 DIAGNOSIS — R809 Proteinuria, unspecified: Secondary | ICD-10-CM | POA: Diagnosis not present

## 2020-08-20 DIAGNOSIS — H903 Sensorineural hearing loss, bilateral: Secondary | ICD-10-CM | POA: Diagnosis not present

## 2020-08-26 DIAGNOSIS — H903 Sensorineural hearing loss, bilateral: Secondary | ICD-10-CM | POA: Diagnosis not present

## 2020-11-22 DIAGNOSIS — E559 Vitamin D deficiency, unspecified: Secondary | ICD-10-CM | POA: Diagnosis not present

## 2020-11-22 DIAGNOSIS — R7303 Prediabetes: Secondary | ICD-10-CM | POA: Diagnosis not present

## 2020-11-22 DIAGNOSIS — I2510 Atherosclerotic heart disease of native coronary artery without angina pectoris: Secondary | ICD-10-CM | POA: Diagnosis not present

## 2020-11-22 DIAGNOSIS — E782 Mixed hyperlipidemia: Secondary | ICD-10-CM | POA: Diagnosis not present

## 2020-11-22 DIAGNOSIS — N1831 Chronic kidney disease, stage 3a: Secondary | ICD-10-CM | POA: Diagnosis not present

## 2020-11-22 DIAGNOSIS — Z125 Encounter for screening for malignant neoplasm of prostate: Secondary | ICD-10-CM | POA: Diagnosis not present

## 2020-11-22 DIAGNOSIS — R944 Abnormal results of kidney function studies: Secondary | ICD-10-CM | POA: Diagnosis not present

## 2020-11-25 DIAGNOSIS — I6529 Occlusion and stenosis of unspecified carotid artery: Secondary | ICD-10-CM | POA: Diagnosis not present

## 2020-11-25 DIAGNOSIS — L853 Xerosis cutis: Secondary | ICD-10-CM | POA: Diagnosis not present

## 2020-11-25 DIAGNOSIS — R059 Cough, unspecified: Secondary | ICD-10-CM | POA: Diagnosis not present

## 2020-11-25 DIAGNOSIS — E782 Mixed hyperlipidemia: Secondary | ICD-10-CM | POA: Diagnosis not present

## 2020-11-25 DIAGNOSIS — N1831 Chronic kidney disease, stage 3a: Secondary | ICD-10-CM | POA: Diagnosis not present

## 2020-11-25 DIAGNOSIS — E559 Vitamin D deficiency, unspecified: Secondary | ICD-10-CM | POA: Diagnosis not present

## 2020-11-25 DIAGNOSIS — R809 Proteinuria, unspecified: Secondary | ICD-10-CM | POA: Diagnosis not present

## 2020-11-25 DIAGNOSIS — M19041 Primary osteoarthritis, right hand: Secondary | ICD-10-CM | POA: Diagnosis not present

## 2020-11-25 DIAGNOSIS — R7303 Prediabetes: Secondary | ICD-10-CM | POA: Diagnosis not present

## 2021-04-13 DIAGNOSIS — H2512 Age-related nuclear cataract, left eye: Secondary | ICD-10-CM | POA: Diagnosis not present

## 2021-04-13 DIAGNOSIS — Z01818 Encounter for other preprocedural examination: Secondary | ICD-10-CM | POA: Diagnosis not present

## 2021-04-13 DIAGNOSIS — H34812 Central retinal vein occlusion, left eye, with macular edema: Secondary | ICD-10-CM | POA: Diagnosis not present

## 2021-04-25 DIAGNOSIS — H402224 Chronic angle-closure glaucoma, left eye, indeterminate stage: Secondary | ICD-10-CM | POA: Diagnosis not present

## 2021-04-25 DIAGNOSIS — H2512 Age-related nuclear cataract, left eye: Secondary | ICD-10-CM | POA: Diagnosis not present

## 2021-05-10 DIAGNOSIS — H40052 Ocular hypertension, left eye: Secondary | ICD-10-CM | POA: Diagnosis not present

## 2021-05-10 DIAGNOSIS — H34812 Central retinal vein occlusion, left eye, with macular edema: Secondary | ICD-10-CM | POA: Diagnosis not present

## 2021-05-10 DIAGNOSIS — H2511 Age-related nuclear cataract, right eye: Secondary | ICD-10-CM | POA: Diagnosis not present

## 2021-05-27 DIAGNOSIS — H2511 Age-related nuclear cataract, right eye: Secondary | ICD-10-CM | POA: Diagnosis not present

## 2021-05-30 DIAGNOSIS — E782 Mixed hyperlipidemia: Secondary | ICD-10-CM | POA: Diagnosis not present

## 2021-05-30 DIAGNOSIS — R7303 Prediabetes: Secondary | ICD-10-CM | POA: Diagnosis not present

## 2021-06-02 DIAGNOSIS — R059 Cough, unspecified: Secondary | ICD-10-CM | POA: Diagnosis not present

## 2021-06-02 DIAGNOSIS — R7303 Prediabetes: Secondary | ICD-10-CM | POA: Diagnosis not present

## 2021-06-02 DIAGNOSIS — L853 Xerosis cutis: Secondary | ICD-10-CM | POA: Diagnosis not present

## 2021-06-02 DIAGNOSIS — R809 Proteinuria, unspecified: Secondary | ICD-10-CM | POA: Diagnosis not present

## 2021-06-02 DIAGNOSIS — E782 Mixed hyperlipidemia: Secondary | ICD-10-CM | POA: Diagnosis not present

## 2021-06-02 DIAGNOSIS — E559 Vitamin D deficiency, unspecified: Secondary | ICD-10-CM | POA: Diagnosis not present

## 2021-06-02 DIAGNOSIS — I6529 Occlusion and stenosis of unspecified carotid artery: Secondary | ICD-10-CM | POA: Diagnosis not present

## 2021-06-02 DIAGNOSIS — N1831 Chronic kidney disease, stage 3a: Secondary | ICD-10-CM | POA: Diagnosis not present

## 2021-06-02 DIAGNOSIS — Z0001 Encounter for general adult medical examination with abnormal findings: Secondary | ICD-10-CM | POA: Diagnosis not present

## 2021-06-17 DIAGNOSIS — H34812 Central retinal vein occlusion, left eye, with macular edema: Secondary | ICD-10-CM | POA: Diagnosis not present

## 2021-06-30 DIAGNOSIS — R809 Proteinuria, unspecified: Secondary | ICD-10-CM | POA: Diagnosis not present

## 2021-06-30 DIAGNOSIS — N1831 Chronic kidney disease, stage 3a: Secondary | ICD-10-CM | POA: Diagnosis not present

## 2021-06-30 DIAGNOSIS — Z125 Encounter for screening for malignant neoplasm of prostate: Secondary | ICD-10-CM | POA: Diagnosis not present

## 2021-07-15 DIAGNOSIS — H34812 Central retinal vein occlusion, left eye, with macular edema: Secondary | ICD-10-CM | POA: Diagnosis not present

## 2021-11-03 DIAGNOSIS — H4053X3 Glaucoma secondary to other eye disorders, bilateral, severe stage: Secondary | ICD-10-CM | POA: Diagnosis not present

## 2021-11-11 DIAGNOSIS — H34812 Central retinal vein occlusion, left eye, with macular edema: Secondary | ICD-10-CM | POA: Diagnosis not present

## 2021-11-18 DIAGNOSIS — H4053X3 Glaucoma secondary to other eye disorders, bilateral, severe stage: Secondary | ICD-10-CM | POA: Diagnosis not present

## 2021-11-24 DIAGNOSIS — E782 Mixed hyperlipidemia: Secondary | ICD-10-CM | POA: Diagnosis not present

## 2021-11-24 DIAGNOSIS — R7303 Prediabetes: Secondary | ICD-10-CM | POA: Diagnosis not present

## 2021-11-30 DIAGNOSIS — H409 Unspecified glaucoma: Secondary | ICD-10-CM | POA: Diagnosis not present

## 2021-11-30 DIAGNOSIS — I6529 Occlusion and stenosis of unspecified carotid artery: Secondary | ICD-10-CM | POA: Diagnosis not present

## 2021-11-30 DIAGNOSIS — L853 Xerosis cutis: Secondary | ICD-10-CM | POA: Diagnosis not present

## 2021-11-30 DIAGNOSIS — E782 Mixed hyperlipidemia: Secondary | ICD-10-CM | POA: Diagnosis not present

## 2021-11-30 DIAGNOSIS — R809 Proteinuria, unspecified: Secondary | ICD-10-CM | POA: Diagnosis not present

## 2021-11-30 DIAGNOSIS — R7303 Prediabetes: Secondary | ICD-10-CM | POA: Diagnosis not present

## 2021-11-30 DIAGNOSIS — E559 Vitamin D deficiency, unspecified: Secondary | ICD-10-CM | POA: Diagnosis not present

## 2021-11-30 DIAGNOSIS — Z125 Encounter for screening for malignant neoplasm of prostate: Secondary | ICD-10-CM | POA: Diagnosis not present

## 2021-11-30 DIAGNOSIS — N1831 Chronic kidney disease, stage 3a: Secondary | ICD-10-CM | POA: Diagnosis not present

## 2021-12-23 DIAGNOSIS — H4052X3 Glaucoma secondary to other eye disorders, left eye, severe stage: Secondary | ICD-10-CM | POA: Diagnosis not present

## 2022-01-03 DIAGNOSIS — H34812 Central retinal vein occlusion, left eye, with macular edema: Secondary | ICD-10-CM | POA: Diagnosis not present

## 2022-04-28 DIAGNOSIS — H4052X3 Glaucoma secondary to other eye disorders, left eye, severe stage: Secondary | ICD-10-CM | POA: Diagnosis not present

## 2022-04-28 DIAGNOSIS — H34812 Central retinal vein occlusion, left eye, with macular edema: Secondary | ICD-10-CM | POA: Diagnosis not present

## 2022-06-30 DIAGNOSIS — R7303 Prediabetes: Secondary | ICD-10-CM | POA: Diagnosis not present

## 2022-06-30 DIAGNOSIS — Z125 Encounter for screening for malignant neoplasm of prostate: Secondary | ICD-10-CM | POA: Diagnosis not present

## 2022-06-30 DIAGNOSIS — E559 Vitamin D deficiency, unspecified: Secondary | ICD-10-CM | POA: Diagnosis not present

## 2022-06-30 DIAGNOSIS — N1831 Chronic kidney disease, stage 3a: Secondary | ICD-10-CM | POA: Diagnosis not present

## 2022-06-30 DIAGNOSIS — E782 Mixed hyperlipidemia: Secondary | ICD-10-CM | POA: Diagnosis not present

## 2022-06-30 DIAGNOSIS — R809 Proteinuria, unspecified: Secondary | ICD-10-CM | POA: Diagnosis not present

## 2022-07-05 DIAGNOSIS — H4052X3 Glaucoma secondary to other eye disorders, left eye, severe stage: Secondary | ICD-10-CM | POA: Diagnosis not present

## 2022-07-05 DIAGNOSIS — H34812 Central retinal vein occlusion, left eye, with macular edema: Secondary | ICD-10-CM | POA: Diagnosis not present

## 2022-07-07 DIAGNOSIS — Z0000 Encounter for general adult medical examination without abnormal findings: Secondary | ICD-10-CM | POA: Diagnosis not present

## 2022-07-07 DIAGNOSIS — R809 Proteinuria, unspecified: Secondary | ICD-10-CM | POA: Diagnosis not present

## 2022-07-07 DIAGNOSIS — E782 Mixed hyperlipidemia: Secondary | ICD-10-CM | POA: Diagnosis not present

## 2022-07-07 DIAGNOSIS — N1831 Chronic kidney disease, stage 3a: Secondary | ICD-10-CM | POA: Diagnosis not present

## 2022-07-07 DIAGNOSIS — L853 Xerosis cutis: Secondary | ICD-10-CM | POA: Diagnosis not present

## 2022-07-07 DIAGNOSIS — H409 Unspecified glaucoma: Secondary | ICD-10-CM | POA: Diagnosis not present

## 2022-07-07 DIAGNOSIS — I6529 Occlusion and stenosis of unspecified carotid artery: Secondary | ICD-10-CM | POA: Diagnosis not present

## 2022-07-07 DIAGNOSIS — E559 Vitamin D deficiency, unspecified: Secondary | ICD-10-CM | POA: Diagnosis not present

## 2022-07-07 DIAGNOSIS — R7303 Prediabetes: Secondary | ICD-10-CM | POA: Diagnosis not present

## 2022-08-02 DIAGNOSIS — H4052X3 Glaucoma secondary to other eye disorders, left eye, severe stage: Secondary | ICD-10-CM | POA: Diagnosis not present

## 2023-01-02 DIAGNOSIS — R809 Proteinuria, unspecified: Secondary | ICD-10-CM | POA: Diagnosis not present

## 2023-01-02 DIAGNOSIS — E559 Vitamin D deficiency, unspecified: Secondary | ICD-10-CM | POA: Diagnosis not present

## 2023-01-02 DIAGNOSIS — R7303 Prediabetes: Secondary | ICD-10-CM | POA: Diagnosis not present

## 2023-01-02 DIAGNOSIS — E782 Mixed hyperlipidemia: Secondary | ICD-10-CM | POA: Diagnosis not present

## 2023-01-08 DIAGNOSIS — R809 Proteinuria, unspecified: Secondary | ICD-10-CM | POA: Diagnosis not present

## 2023-01-08 DIAGNOSIS — E782 Mixed hyperlipidemia: Secondary | ICD-10-CM | POA: Diagnosis not present

## 2023-01-08 DIAGNOSIS — H5462 Unqualified visual loss, left eye, normal vision right eye: Secondary | ICD-10-CM | POA: Diagnosis not present

## 2023-01-08 DIAGNOSIS — I6529 Occlusion and stenosis of unspecified carotid artery: Secondary | ICD-10-CM | POA: Diagnosis not present

## 2023-01-08 DIAGNOSIS — I659 Occlusion and stenosis of unspecified precerebral artery: Secondary | ICD-10-CM | POA: Diagnosis not present

## 2023-01-08 DIAGNOSIS — R7303 Prediabetes: Secondary | ICD-10-CM | POA: Diagnosis not present

## 2023-01-08 DIAGNOSIS — E559 Vitamin D deficiency, unspecified: Secondary | ICD-10-CM | POA: Diagnosis not present

## 2023-01-08 DIAGNOSIS — N1831 Chronic kidney disease, stage 3a: Secondary | ICD-10-CM | POA: Diagnosis not present

## 2023-01-08 DIAGNOSIS — H409 Unspecified glaucoma: Secondary | ICD-10-CM | POA: Diagnosis not present

## 2023-02-06 DIAGNOSIS — H4052X3 Glaucoma secondary to other eye disorders, left eye, severe stage: Secondary | ICD-10-CM | POA: Diagnosis not present

## 2023-07-05 DIAGNOSIS — R7303 Prediabetes: Secondary | ICD-10-CM | POA: Diagnosis not present

## 2023-07-05 DIAGNOSIS — R809 Proteinuria, unspecified: Secondary | ICD-10-CM | POA: Diagnosis not present

## 2023-07-05 DIAGNOSIS — E782 Mixed hyperlipidemia: Secondary | ICD-10-CM | POA: Diagnosis not present

## 2023-07-05 DIAGNOSIS — E559 Vitamin D deficiency, unspecified: Secondary | ICD-10-CM | POA: Diagnosis not present

## 2023-07-11 DIAGNOSIS — E559 Vitamin D deficiency, unspecified: Secondary | ICD-10-CM | POA: Diagnosis not present

## 2023-07-11 DIAGNOSIS — R7303 Prediabetes: Secondary | ICD-10-CM | POA: Diagnosis not present

## 2023-07-11 DIAGNOSIS — R809 Proteinuria, unspecified: Secondary | ICD-10-CM | POA: Diagnosis not present

## 2023-07-11 DIAGNOSIS — H25013 Cortical age-related cataract, bilateral: Secondary | ICD-10-CM | POA: Diagnosis not present

## 2023-07-11 DIAGNOSIS — I6529 Occlusion and stenosis of unspecified carotid artery: Secondary | ICD-10-CM | POA: Diagnosis not present

## 2023-07-11 DIAGNOSIS — H409 Unspecified glaucoma: Secondary | ICD-10-CM | POA: Diagnosis not present

## 2023-07-11 DIAGNOSIS — E782 Mixed hyperlipidemia: Secondary | ICD-10-CM | POA: Diagnosis not present

## 2023-07-11 DIAGNOSIS — N1831 Chronic kidney disease, stage 3a: Secondary | ICD-10-CM | POA: Diagnosis not present

## 2023-07-11 DIAGNOSIS — H5442A5 Blindness left eye category 5, normal vision right eye: Secondary | ICD-10-CM | POA: Diagnosis not present

## 2023-07-23 DIAGNOSIS — H4052X3 Glaucoma secondary to other eye disorders, left eye, severe stage: Secondary | ICD-10-CM | POA: Diagnosis not present

## 2023-09-17 DIAGNOSIS — H4052X3 Glaucoma secondary to other eye disorders, left eye, severe stage: Secondary | ICD-10-CM | POA: Diagnosis not present

## 2023-09-17 DIAGNOSIS — H2102 Hyphema, left eye: Secondary | ICD-10-CM | POA: Diagnosis not present

## 2024-01-02 DIAGNOSIS — E559 Vitamin D deficiency, unspecified: Secondary | ICD-10-CM | POA: Diagnosis not present

## 2024-01-02 DIAGNOSIS — E782 Mixed hyperlipidemia: Secondary | ICD-10-CM | POA: Diagnosis not present

## 2024-01-02 DIAGNOSIS — R809 Proteinuria, unspecified: Secondary | ICD-10-CM | POA: Diagnosis not present

## 2024-01-02 DIAGNOSIS — R7303 Prediabetes: Secondary | ICD-10-CM | POA: Diagnosis not present

## 2024-01-09 DIAGNOSIS — R809 Proteinuria, unspecified: Secondary | ICD-10-CM | POA: Diagnosis not present

## 2024-01-09 DIAGNOSIS — N1831 Chronic kidney disease, stage 3a: Secondary | ICD-10-CM | POA: Diagnosis not present

## 2024-01-09 DIAGNOSIS — R7303 Prediabetes: Secondary | ICD-10-CM | POA: Diagnosis not present

## 2024-01-09 DIAGNOSIS — Z79899 Other long term (current) drug therapy: Secondary | ICD-10-CM | POA: Diagnosis not present

## 2024-01-09 DIAGNOSIS — E782 Mixed hyperlipidemia: Secondary | ICD-10-CM | POA: Diagnosis not present

## 2024-01-09 DIAGNOSIS — Z7982 Long term (current) use of aspirin: Secondary | ICD-10-CM | POA: Diagnosis not present

## 2024-01-09 DIAGNOSIS — H5442A5 Blindness left eye category 5, normal vision right eye: Secondary | ICD-10-CM | POA: Diagnosis not present

## 2024-01-09 DIAGNOSIS — E559 Vitamin D deficiency, unspecified: Secondary | ICD-10-CM | POA: Diagnosis not present

## 2024-01-09 DIAGNOSIS — H409 Unspecified glaucoma: Secondary | ICD-10-CM | POA: Diagnosis not present

## 2024-01-09 DIAGNOSIS — H25013 Cortical age-related cataract, bilateral: Secondary | ICD-10-CM | POA: Diagnosis not present

## 2024-01-09 DIAGNOSIS — I6529 Occlusion and stenosis of unspecified carotid artery: Secondary | ICD-10-CM | POA: Diagnosis not present

## 2024-01-15 DIAGNOSIS — H04123 Dry eye syndrome of bilateral lacrimal glands: Secondary | ICD-10-CM | POA: Diagnosis not present

## 2024-01-15 DIAGNOSIS — H2102 Hyphema, left eye: Secondary | ICD-10-CM | POA: Diagnosis not present

## 2024-01-21 ENCOUNTER — Other Ambulatory Visit: Payer: Self-pay

## 2024-01-21 DIAGNOSIS — I6523 Occlusion and stenosis of bilateral carotid arteries: Secondary | ICD-10-CM

## 2024-02-20 ENCOUNTER — Ambulatory Visit (HOSPITAL_COMMUNITY)
Admission: RE | Admit: 2024-02-20 | Discharge: 2024-02-20 | Disposition: A | Discharge status: Home / Self Care | Source: Ambulatory Visit | Attending: Vascular Surgery | Admitting: Vascular Surgery

## 2024-02-20 ENCOUNTER — Encounter: Payer: Self-pay | Admitting: Vascular Surgery

## 2024-02-20 ENCOUNTER — Ambulatory Visit: Attending: Vascular Surgery | Admitting: Vascular Surgery

## 2024-02-20 VITALS — BP 121/82 | HR 61 | Temp 98.4°F | Ht 73.0 in | Wt 182.0 lb

## 2024-02-20 DIAGNOSIS — I6523 Occlusion and stenosis of bilateral carotid arteries: Secondary | ICD-10-CM | POA: Diagnosis not present

## 2024-02-20 NOTE — Progress Notes (Signed)
 Patient ID: Zachary Edwards, male   DOB: May 08, 1946, 78 y.o.   MRN: [id number]  Reason for Consult: New Patient (Initial Visit)   Referred by [name], MD  Subjective:     HPI:  Zachary Edwards is a 78 y.o. male history of remote carotid endarterectomy in 2007 of the left side complicated by reperfusion injury.  Last evaluated in our office over 9 years ago.  Now returns for evaluation of carotid stenosis.  He does have hypertension hyperlipidemia and is a former smoker but quit over a decade ago.  He denies stroke, TIA or amaurosis.  He has hyperlipidemia hypertension and diagnosis of COPD but does not require oxygen.  He does very little walking stating that he mostly watches old Western movies in the chair that he does have occasional leg pain but no tissue loss or ulceration.  Past Medical History:  Diagnosis Date   Carotid artery occlusion    COPD (chronic obstructive pulmonary disease) (HCC)    Hyperlipidemia    Hypertension    Stroke [facility])    Family History  Problem Relation Age of Onset   Cancer Mother    Past Surgical History:  Procedure Laterality Date   CAROTID ENDARTERECTOMY  06/19/2009   Left CEA    Short Social History:  Social History   Tobacco Use   Smoking status: Former    Current packs/day: 0.00    Types: Cigarettes    Quit date: 09/17/2009    Years since quitting: 14.4   Smokeless tobacco: Not on file  Substance Use Topics   Alcohol use: No    Allergies  Allergen Reactions   Penicillins     Current Outpatient Medications  Medication Sig Dispense Refill   amLODipine (NORVASC) 10 MG tablet Take 10 mg by mouth daily.     aspirin 81 MG tablet Take 81 mg by mouth daily.     brimonidine (ALPHAGAN) 0.2 % ophthalmic solution SMARTSIG:In Eye(s)     dorzolamide-timolol (COSOPT) 2-0.5 % ophthalmic solution SMARTSIG:In Eye(s)     hydrochlorothiazide (HYDRODIURIL) 25 MG tablet Take 25 mg by mouth daily.     levETIRAcetam (KEPPRA) 1000 MG tablet Take  1,000 mg by mouth 2 (two) times daily.     lisinopril (ZESTRIL) 2.5 MG tablet Take 2.5 mg by mouth daily.     rosuvastatin (CRESTOR) 20 MG tablet Take 20 mg by mouth at bedtime.     No current facility-administered medications for this visit.    Review of Systems  Constitutional:  Constitutional negative. HENT: HENT negative.  Eyes: Eyes negative.  Cardiovascular: Cardiovascular negative.  GI: Gastrointestinal negative.  Musculoskeletal: Positive for leg pain.  Neurological: Neurological negative. Hematologic: Hematologic/lymphatic negative.  Psychiatric: Psychiatric negative.        Objective:  Objective   There were no vitals filed for this visit. There is no height or weight on file to calculate BMI.  Physical Exam HENT:     Head: Normocephalic.     Nose: Nose normal.  Eyes:     Pupils: Pupils are equal, round, and reactive to light.  Neck:     Vascular: No carotid bruit.  Cardiovascular:     Pulses:          Carotid pulses are 2+ on the right side and 2+ on the left side.      Radial pulses are 1+ on the right side and 2+ on the left side.       Dorsalis pedis pulses  are 0 on the right side and 0 on the left side.       Posterior tibial pulses are 0 on the right side and 0 on the left side.  Abdominal:     General: Abdomen is flat.     Palpations: Abdomen is soft.  Musculoskeletal:     Cervical back: No tenderness.  Skin:    Capillary Refill: Capillary refill takes less than 2 seconds.  Neurological:     General: No focal deficit present.  Psychiatric:        Mood and Affect: Mood normal.     Data: Right Carotid Findings:  +----------+--------+--------+--------+-------------------------+----------  +           PSV cm/sEDV cm/sStenosisPlaque Description       Comments     +----------+--------+--------+--------+-------------------------+----------  +  CCA Prox  40      0       <50%    homogeneous               retrograde   +----------+--------+--------+--------+-------------------------+----------  +  CCA Mid                                                     retrograde  +----------+--------+--------+--------+-------------------------+----------  +  CCA Distal48      0                                         retrograde  +----------+--------+--------+--------+-------------------------+----------  +  ICA Prox  31      0       1-39%   calcific and  heterogenousretrograde  +----------+--------+--------+--------+-------------------------+----------  +  ICA Mid   13      4                                        pre-steal    +----------+--------+--------+--------+-------------------------+----------  +  ICA Distal14      3                                        pre-steal    +----------+--------+--------+--------+-------------------------+----------  +  ECA      62      0               heterogenous              retrograde  +----------+--------+--------+--------+-------------------------+----------  +   +----------+--------+-------+--------+-------------------+           PSV cm/sEDV cmsDescribeArm Pressure (mmHG)  +----------+--------+-------+--------+-------------------+  [id number]     0              120                  +----------+--------+-------+--------+-------------------+   +---------+--------+--+--------+-+----------+  VertebralPSV cm/s53EDV cm/s0Retrograde  +---------+--------+--+--------+-+----------+      Left Carotid Findings:  +----------+--------+--------+--------+------------------+--------+           PSV cm/sEDV cm/sStenosisPlaque DescriptionComments  +----------+--------+--------+--------+------------------+--------+  CCA Prox  121     14                                          +----------+--------+--------+--------+------------------+--------+  CCA Mid   276     31      >50%    heterogenous                 +----------+--------+--------+--------+------------------+--------+  CCA Distal376     40      >50%    heterogenous                +----------+--------+--------+--------+------------------+--------+  ICA Prox  112     9       1-39%   heterogenous                +----------+--------+--------+--------+------------------+--------+  ICA Mid   115     23      1-39%                               +----------+--------+--------+--------+------------------+--------+  ICA Distal122     21      1-39%                               +----------+--------+--------+--------+------------------+--------+  ECA      207     7               homogeneous                 +----------+--------+--------+--------+------------------+--------+   +----------+--------+--------+--------+-------------------+           PSV cm/sEDV cm/sDescribeArm Pressure (mmHG)  +----------+--------+--------+--------+-------------------+  Subclavian184    0               131                  +----------+--------+--------+--------+-------------------+   +---------+--------+--+--------+-+----------+  VertebralPSV cm/s76EDV cm/s0Retrograde  +---------+--------+--+--------+-+----------+     Summary:  Right Carotid: Velocities in the right ICA are consistent with a 1-39%  stenosis.                Non-hemodynamically significant plaque <50% noted in the  CCA. The                 ECA appears <50% stenosed. 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 Retrograde flow is seen in the CCA, ECA, and vertebral  artery.                Monophasic flow is seen in the subclavian artery. Pre-steal                 waveform is seen in the mid and distal ICA.   Left Carotid: Velocities in the left ICA are consistent with a 1-39%  stenosis.               Hemodynamically significant plaque >50% visualized in the  CCA. The                ECA appears >50% stenosed.    Vertebrals:  Bilateral vertebral arteries demonstrate retrograde flow.  Subclavians: Monophasic flow is seen bilaterally in the subclavian  arteries.      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  He does not have palpable tibial pulses by my exam today but does not have any ulcers  and minimal leg symptoms.  I will have him follow-up with repeat carotid duplex and ABIs in 1 year unless he has issues prior to that.  All questions were answered he demonstrates good understanding.     Penne Lonni Colorado MD [facility]

## 2024-05-02 DIAGNOSIS — H04123 Dry eye syndrome of bilateral lacrimal glands: Secondary | ICD-10-CM | POA: Diagnosis not present

## 2024-05-02 DIAGNOSIS — H4052X3 Glaucoma secondary to other eye disorders, left eye, severe stage: Secondary | ICD-10-CM | POA: Diagnosis not present
